# Patient Record
Sex: Male | Born: 1985 | Hispanic: No | Marital: Single | Smoking: Current every day smoker
Health system: Southern US, Community
[De-identification: ages and names within clinical notes are randomized; demographics above are authoritative.]

## PROBLEM LIST (undated history)

## (undated) DIAGNOSIS — K259 Gastric ulcer, unspecified as acute or chronic, without hemorrhage or perforation: Secondary | ICD-10-CM

---
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
## 2008-04-07 ENCOUNTER — Emergency Department (HOSPITAL_COMMUNITY): Admission: EM | Admit: 2008-04-07 | Discharge: 2008-04-08 | Payer: Self-pay | Admitting: Emergency Medicine

## 2009-08-14 ENCOUNTER — Emergency Department (HOSPITAL_COMMUNITY): Admission: EM | Admit: 2009-08-14 | Discharge: 2009-08-14 | Payer: Self-pay | Admitting: Emergency Medicine

## 2011-02-03 LAB — CBC
Platelets: 186 10*3/uL (ref 150–400)
RDW: 12.5 % (ref 11.5–15.5)
WBC: 9.3 10*3/uL (ref 4.0–10.5)

## 2011-02-03 LAB — DIFFERENTIAL
Basophils Relative: 0 % (ref 0–1)
Monocytes Absolute: 0.7 10*3/uL (ref 0.1–1.0)
Neutro Abs: 4.5 10*3/uL (ref 1.7–7.7)
Neutrophils Relative %: 49 % (ref 43–77)

## 2011-02-03 LAB — URINALYSIS, ROUTINE W REFLEX MICROSCOPIC
Bilirubin Urine: NEGATIVE
Hgb urine dipstick: NEGATIVE
Ketones, ur: NEGATIVE mg/dL
Specific Gravity, Urine: 1.022 (ref 1.005–1.030)
Urobilinogen, UA: 1 mg/dL (ref 0.0–1.0)
pH: 7 (ref 5.0–8.0)

## 2011-02-03 LAB — COMPREHENSIVE METABOLIC PANEL
ALT: 15 U/L (ref 0–53)
Albumin: 4.7 g/dL (ref 3.5–5.2)
Calcium: 9.8 mg/dL (ref 8.4–10.5)
GFR calc non Af Amer: >60 mL/min (ref >60)
Glucose, Bld: 102 mg/dL — ABNORMAL HIGH (ref 70–99)
Potassium: 3.7 mEq/L (ref 3.5–5.1)
Sodium: 137 mEq/L (ref 135–145)
Total Bilirubin: 0.4 mg/dL (ref 0.3–1.2)
Total Protein: 7.9 g/dL (ref 6.0–8.3)

## 2011-06-19 ENCOUNTER — Emergency Department (HOSPITAL_COMMUNITY)
Admission: EM | Admit: 2011-06-19 | Discharge: 2011-06-20 | Disposition: A | Discharge status: Home / Self Care | Payer: Self-pay | Attending: Emergency Medicine | Admitting: Emergency Medicine

## 2011-06-19 DIAGNOSIS — R1013 Epigastric pain: Secondary | ICD-10-CM | POA: Insufficient documentation

## 2011-06-19 DIAGNOSIS — R112 Nausea with vomiting, unspecified: Secondary | ICD-10-CM | POA: Insufficient documentation

## 2011-06-19 DIAGNOSIS — K2970 Gastritis, unspecified, without bleeding: Secondary | ICD-10-CM | POA: Insufficient documentation

## 2011-06-19 DIAGNOSIS — K2990 Gastroduodenitis, unspecified, without bleeding: Secondary | ICD-10-CM | POA: Insufficient documentation

## 2011-06-19 LAB — DIFFERENTIAL
Basophils Absolute: 0 10*3/uL (ref 0.0–0.1)
Eosinophils Absolute: 0.6 10*3/uL (ref 0.0–0.7)
Eosinophils Relative: 6 % — ABNORMAL HIGH (ref 0–5)
Lymphs Abs: 2.4 10*3/uL (ref 0.7–4.0)

## 2011-06-19 LAB — COMPREHENSIVE METABOLIC PANEL
Albumin: 4.5 g/dL (ref 3.5–5.2)
CO2: 31 mEq/L (ref 19–32)
Calcium: 11.4 mg/dL — ABNORMAL HIGH (ref 8.4–10.5)
Chloride: 96 mEq/L (ref 96–112)
GFR calc non Af Amer: >60 mL/min (ref >60)
Total Bilirubin: 0.7 mg/dL (ref 0.3–1.2)
Total Protein: 8.2 g/dL (ref 6.0–8.3)

## 2011-06-19 LAB — CBC
Hemoglobin: 14.8 g/dL (ref 13.0–17.0)
MCV: 88.6 fL (ref 78.0–100.0)
Platelets: 195 10*3/uL (ref 150–400)

## 2011-07-28 LAB — LIPASE, BLOOD: Lipase: 15

## 2011-07-28 LAB — URINALYSIS, ROUTINE W REFLEX MICROSCOPIC
Bilirubin Urine: NEGATIVE
Hgb urine dipstick: NEGATIVE
Ketones, ur: NEGATIVE
pH: 7.5

## 2011-07-28 LAB — POCT I-STAT, CHEM 8
Calcium, Ion: 1.2
Creatinine, Ser: 1.2
HCT: 48
Hemoglobin: 16.3
Potassium: 3.5

## 2011-07-28 LAB — DIFFERENTIAL
Eosinophils Relative: 8 — ABNORMAL HIGH
Monocytes Absolute: 0.7
Neutro Abs: 5.5
Neutrophils Relative %: 61

## 2011-07-28 LAB — CBC
Hemoglobin: 15.2
MCHC: 34.7
Platelets: 191
RDW: 13.1

## 2012-02-29 ENCOUNTER — Emergency Department (HOSPITAL_COMMUNITY)
Admission: EM | Admit: 2012-02-29 | Discharge: 2012-02-29 | Disposition: A | Discharge status: Home / Self Care | Payer: Worker's Compensation | Attending: Emergency Medicine | Admitting: Emergency Medicine

## 2012-02-29 ENCOUNTER — Encounter (HOSPITAL_COMMUNITY): Payer: Self-pay

## 2012-02-29 ENCOUNTER — Emergency Department (HOSPITAL_COMMUNITY): Payer: Worker's Compensation

## 2012-02-29 DIAGNOSIS — S93409A Sprain of unspecified ligament of unspecified ankle, initial encounter: Secondary | ICD-10-CM | POA: Insufficient documentation

## 2012-02-29 DIAGNOSIS — F17200 Nicotine dependence, unspecified, uncomplicated: Secondary | ICD-10-CM | POA: Insufficient documentation

## 2012-02-29 DIAGNOSIS — W19XXXA Unspecified fall, initial encounter: Secondary | ICD-10-CM | POA: Insufficient documentation

## 2012-02-29 DIAGNOSIS — S93402A Sprain of unspecified ligament of left ankle, initial encounter: Secondary | ICD-10-CM

## 2012-02-29 DIAGNOSIS — Y9269 Other specified industrial and construction area as the place of occurrence of the external cause: Secondary | ICD-10-CM | POA: Insufficient documentation

## 2012-02-29 DIAGNOSIS — M25579 Pain in unspecified ankle and joints of unspecified foot: Secondary | ICD-10-CM | POA: Insufficient documentation

## 2012-02-29 MED ORDER — OXYCODONE-ACETAMINOPHEN 5-325 MG PO TABS: 2.0000 | ORAL_TABLET | Freq: Four times a day (QID) | ORAL | Status: AC | PRN

## 2012-02-29 MED ORDER — IBUPROFEN 800 MG PO TABS: 800.0000 mg | ORAL_TABLET | Freq: Three times a day (TID) | ORAL | Status: AC | PRN

## 2012-02-29 MED ORDER — IBUPROFEN 800 MG PO TABS: 800.0000 mg | ORAL_TABLET | Freq: Once | ORAL | Status: DC

## 2012-02-29 MED ORDER — OXYCODONE-ACETAMINOPHEN 5-325 MG PO TABS
2.0000 | ORAL_TABLET | Freq: Once | ORAL | Status: AC
  Administered 2012-02-29: 2 via ORAL
  Filled 2012-02-29: qty 2

## 2012-02-29 NOTE — ED Notes (Signed)
Pt. Larey Seat about 30 minutes ago and fell and twisted onto his lt. Ankle. Swelling noted, +CNS

## 2012-02-29 NOTE — Discharge Instructions (Signed)

## 2012-02-29 NOTE — ED Provider Notes (Signed)
History   This chart was scribed for Cyndra Numbers, MD by Melba Coon. The patient was seen in room STRE5/STRE5 and the patient's care was started at 1:03PM.    CSN: [id number]  Arrival date & time 02/29/12  1240   First MD Initiated Contact with Patient 02/29/12 1257      Chief Complaint  Patient presents with  . Ankle Pain    (Consider location/radiation/quality/duration/timing/severity/associated sxs/prior treatment) HPI Joshua Gutierrez is a 26 y.o. male who presents to the Emergency Department complaining of constant, moderate to severe left ankle pain with an onset 45 min ago pertaining to a fall at work, no LOC, no head contact. Pt could not ambulate w/o pain. No left foot pain. No HA, fever, neck pain, sore throat, rash, back pain, CP, SOB, abd pain, n/v/d, dysuria, or extremity weakness, numbness, or tingling. No known allergies. No other pertinent medical symptoms. Pain is rated as sharp and 7/10. He has swelling of the left lateral malleolus without other significant deformity.  History reviewed. No pertinent past medical history.  History reviewed. No pertinent past surgical history.  History reviewed. No pertinent family history.  History  Substance Use Topics  . Smoking status: Current Everyday Smoker  . Smokeless tobacco: Not on file  . Alcohol Use: No      Review of Systems 10 Systems reviewed and all are negative for acute change except as noted in the HPI.   Allergies  Review of patient's allergies indicates no known allergies.  Home Medications  No current outpatient prescriptions on file.  BP 117/70  Pulse 101  Temp(Src) 97.8 F (36.6 C) (Oral)  Resp 16  SpO2 99%  Physical Exam  Nursing note and vitals reviewed. Constitutional: He is oriented to person, place, and time. He appears well-developed and well-nourished. No distress.  HENT:  Head: Normocephalic and atraumatic.  Eyes: EOM are normal.  Neck: Neck supple. No tracheal deviation present.   Cardiovascular: Normal rate.   Pulmonary/Chest: Effort normal. No respiratory distress.  Musculoskeletal: He exhibits edema (left lateral malleolus) and tenderness (left lateral malleolus).       Left ankle: He exhibits decreased range of motion (left lateral malleolus with decreased plantar and dorsiflexion ).       No TTP of left foot or left proximal lower extremity  Neurological: He is alert and oriented to person, place, and time.  Skin: Skin is warm and dry.  Psychiatric: He has a normal mood and affect. His behavior is normal.    ED Course  Procedures (including critical care time)  DIAGNOSTIC STUDIES: Oxygen Saturation is 99% on room aior, normal by my interpretation.    COORDINATION OF CARE:  1:04PM - left ankle XR, ice and pain meds will be ordered for the pt. 2:17PM - recheck; pt feel slightly better but there is still pain; imaging reviewed by EDMD and is negative; pt is ready for d/c; EDMD referred pt to f/u with orthopedist if needed; pt will receive ACE wrap, crutches, and ice  Labs Reviewed - No data to display Dg Ankle Complete Left  02/29/2012  *RADIOLOGY REPORT*  Clinical Data:  Ankle injury.  Severe ankle pain, swelling, and bruising.  LEFT ANKLE COMPLETE - 3+ VIEW  Comparison:  None.  Findings:  There is no evidence of fracture, dislocation, or joint effusion.  There is no evidence of arthropathy or other focal bone abnormality.  Soft tissues are unremarkable.  IMPRESSION: Negative.  Original Report Authenticated By: Danae Orleans, M.D.  1. Left ankle sprain       MDM  Patient presenting today with left ankle pain after mechanical injury. Patient with swelling but no fracture on plain film. Treated with pain medication with improved range of motion. Ace wrap and ice pack applied. Patient was given crutches and can followup with orthopedics as needed. He was discharged home in good condition.  I personally performed the services described in this  documentation, which was scribed in my presence. The recorded information has been reviewed and considered.          Cyndra Numbers, MD 02/29/12 1444

## 2012-02-29 NOTE — Progress Notes (Signed)
Orthopedic Tech Progress Note Patient Details:  Joshua Gutierrez [birth year] [id number]  Other Ortho Devices Type of Ortho Device: Crutches;Ace wrap Ortho Device Location: left ankle Ortho Device Interventions: Application Application completed by J. Cammer.  Leo Grosser T 02/29/2012, 2:31 PM

## 2013-11-13 ENCOUNTER — Emergency Department (HOSPITAL_COMMUNITY)
Admission: EM | Admit: 2013-11-13 | Discharge: 2013-11-13 | Discharge status: Against Medical Advice | Payer: PRIVATE HEALTH INSURANCE | Attending: Emergency Medicine | Admitting: Emergency Medicine

## 2013-11-13 ENCOUNTER — Encounter (HOSPITAL_COMMUNITY): Payer: Self-pay | Admitting: Emergency Medicine

## 2013-11-13 DIAGNOSIS — F17200 Nicotine dependence, unspecified, uncomplicated: Secondary | ICD-10-CM | POA: Insufficient documentation

## 2013-11-13 DIAGNOSIS — R1110 Vomiting, unspecified: Secondary | ICD-10-CM | POA: Insufficient documentation

## 2013-11-13 DIAGNOSIS — T50901A Poisoning by unspecified drugs, medicaments and biological substances, accidental (unintentional), initial encounter: Secondary | ICD-10-CM | POA: Insufficient documentation

## 2013-11-13 DIAGNOSIS — I10 Essential (primary) hypertension: Secondary | ICD-10-CM | POA: Insufficient documentation

## 2013-11-13 HISTORY — DX: Gastric ulcer, unspecified as acute or chronic, without hemorrhage or perforation: K25.9

## 2013-11-13 LAB — CBC WITH DIFFERENTIAL/PLATELET
Basophils Absolute: 0 10*3/uL (ref 0.0–0.1)
Basophils Relative: 0 % (ref 0–1)
EOS ABS: 0.6 10*3/uL (ref 0.0–0.7)
EOS PCT: 7 % — AB (ref 0–5)
HEMATOCRIT: 43.4 % (ref 39.0–52.0)
HEMOGLOBIN: 14.7 g/dL (ref 13.0–17.0)
Lymphocytes Relative: 33 % (ref 12–46)
Lymphs Abs: 3.2 10*3/uL (ref 0.7–4.0)
MCH: 30.6 pg (ref 26.0–34.0)
MCHC: 33.9 g/dL (ref 30.0–36.0)
MCV: 90.4 fL (ref 78.0–100.0)
MONOS PCT: 7 % (ref 3–12)
Monocytes Absolute: 0.7 10*3/uL (ref 0.1–1.0)
Neutro Abs: 5.1 10*3/uL (ref 1.7–7.7)
Neutrophils Relative %: 53 % (ref 43–77)
Platelets: 207 10*3/uL (ref 150–400)
RBC: 4.8 MIL/uL (ref 4.22–5.81)
RDW: 12.6 % (ref 11.5–15.5)
WBC: 9.6 10*3/uL (ref 4.0–10.5)

## 2013-11-13 LAB — LIPASE, BLOOD: Lipase: 16 U/L (ref 11–59)

## 2013-11-13 LAB — COMPREHENSIVE METABOLIC PANEL
ALT: 28 U/L (ref 0–53)
AST: 29 U/L (ref 0–37)
Albumin: 4.2 g/dL (ref 3.5–5.2)
Alkaline Phosphatase: 68 U/L (ref 39–117)
BUN: 17 mg/dL (ref 6–23)
CO2: 29 mEq/L (ref 19–32)
CREATININE: 0.74 mg/dL (ref 0.50–1.35)
Calcium: 9.6 mg/dL (ref 8.4–10.5)
Chloride: 100 mEq/L (ref 96–112)
GFR calc Af Amer: >90 mL/min (ref >90)
GFR calc non Af Amer: >90 mL/min (ref >90)
Glucose, Bld: 106 mg/dL — ABNORMAL HIGH (ref 70–99)
Potassium: 3.5 mEq/L — ABNORMAL LOW (ref 3.7–5.3)
Sodium: 142 mEq/L (ref 137–147)
Total Bilirubin: 0.2 mg/dL — ABNORMAL LOW (ref 0.3–1.2)
Total Protein: 8 g/dL (ref 6.0–8.3)

## 2013-11-13 NOTE — ED Notes (Signed)
Unable to locate pt  

## 2013-11-13 NOTE — ED Notes (Signed)
Pt in c/o vomiting since yesterday, states today he took an unknown medication that a friend told him was for ulcers approx 1 hour ago, took 1 pill, friend states patient had a near syncopal episode after taking pill and became pale, patient continues to vomit, VSS during triage, pt alert and oriented at this time

## 2013-11-13 NOTE — ED Notes (Signed)
No answer in waiting area.

## 2014-02-23 ENCOUNTER — Emergency Department (INDEPENDENT_AMBULATORY_CARE_PROVIDER_SITE_OTHER)
Admission: EM | Admit: 2014-02-23 | Discharge: 2014-02-23 | Disposition: A | Discharge status: Home / Self Care | Payer: PRIVATE HEALTH INSURANCE | Source: Home / Self Care

## 2014-02-23 ENCOUNTER — Encounter (HOSPITAL_COMMUNITY): Payer: Self-pay | Admitting: Emergency Medicine

## 2014-02-23 DIAGNOSIS — M436 Torticollis: Secondary | ICD-10-CM

## 2014-02-23 MED ORDER — HYDROCODONE-ACETAMINOPHEN 5-325 MG PO TABS: 1.0000 | ORAL_TABLET | ORAL | Status: DC | PRN

## 2014-02-23 MED ORDER — CYCLOBENZAPRINE HCL 10 MG PO TABS: 10.0000 mg | ORAL_TABLET | Freq: Two times a day (BID) | ORAL | Status: DC | PRN

## 2014-02-23 NOTE — Discharge Instructions (Signed)
Torticollis, Acute °You have suddenly (acutely) developed a twisted neck (torticollis). This is usually a self-limited condition. °CAUSES  °Acute torticollis may be caused by malposition, trauma or infection. Most commonly, acute torticollis is caused by sleeping in an awkward position. Torticollis may also be caused by the flexion, extension or twisting of the neck muscles beyond their normal position. Sometimes, the exact cause may not be known. °SYMPTOMS  °Usually, there is pain and limited movement of the neck. Your neck may twist to one side. °DIAGNOSIS  °The diagnosis is often made by physical examination. X-rays, CT scans or MRIs may be done if there is a history of trauma or concern of infection. °TREATMENT  °For a common, stiff neck that develops during sleep, treatment is focused on relaxing the contracted neck muscle. Medications (including shots) may be used to treat the problem. Most cases resolve in several days. Torticollis usually responds to conservative physical therapy. If left untreated, the shortened and spastic neck muscle can cause deformities in the face and neck. Rarely, surgery is required. °HOME CARE INSTRUCTIONS  °· Use over-the-counter and prescription medications as directed by your caregiver. °· Do stretching exercises and massage the neck as directed by your caregiver. °· Follow up with physical therapy if needed and as directed by your caregiver. °SEEK IMMEDIATE MEDICAL CARE IF:  °· You develop difficulty breathing or noisy breathing (stridor). °· You drool, develop trouble swallowing or have pain with swallowing. °· You develop numbness or weakness in the hands or feet. °· You have changes in speech or vision. °· You have problems with urination or bowel movements. °· You have difficulty walking. °· You have a fever. °· You have increased pain. °MAKE SURE YOU:  °· Understand these instructions. °· Will watch your condition. °· Will get help right away if you are not doing well or  get worse. °Document Released: 10/14/2000 Document Revised: 01/09/2012 Document Reviewed: 11/25/2009 °[employer]® Patient Information ©2014 [employer]. ° °

## 2014-02-23 NOTE — ED Provider Notes (Signed)
CSN: [id number]     Arrival date & time 02/23/14  1027 History   None    Chief Complaint  Patient presents with  . Neck Pain   (Consider location/radiation/quality/duration/timing/severity/associated sxs/prior Treatment)  HPI  She is a 28 year old male presenting today with complaints of "neck pain"  The patient states onset this past Friday and states hasn't been able to move his neck from side to side secondary to discomfort.  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  The patient states he is "unable to move" secondary to discomfort. Patient required assistance of girlfriend to be able to sit up on exam table.  Past Medical History  Diagnosis Date  . Gastric ulcer    History reviewed. No pertinent past surgical history. No family history on file. History  Substance Use Topics  . Smoking status: Current Every Day Smoker -- 0.50 packs/day    Types: Cigarettes  . Smokeless tobacco: Not on file  . Alcohol Use: Yes     Comment: occasionally    Review of Systems  Constitutional: Negative.  Negative for fever and fatigue.  HENT: Negative.  Negative for congestion, ear discharge, ear pain, mouth sores, nosebleeds, sinus pressure and sore throat.   Eyes: Negative.   Respiratory: Negative.   Cardiovascular: Negative.   Gastrointestinal: Negative.  Negative for nausea, vomiting and diarrhea.  Endocrine: Negative.   Genitourinary: Negative.   Musculoskeletal: Positive for neck pain and neck stiffness. Negative for arthralgias, back pain, gait problem, joint swelling and myalgias.  Skin: Negative.  Negative for color change, pallor, rash and wound.  Allergic/Immunologic: Positive for environmental allergies. Negative for food allergies and immunocompromised state.  Neurological: Negative.  Negative for dizziness, tremors, seizures, speech difficulty, weakness, light-headedness and numbness.   Hematological: Negative.   Psychiatric/Behavioral: Negative.     Allergies  Zolpidem  Home Medications   Prior to Admission medications   Not on File   BP 118/82  Pulse 73  Temp(Src) 97.1 F (36.2 C) (Oral)  Resp 16  SpO2 98%  Physical Exam  Nursing note and vitals reviewed. Constitutional: He is oriented to person, place, and time. He appears well-developed and well-nourished. No distress.  HENT:  Head: Normocephalic and atraumatic.  Right Ear: External ear normal.  Left Ear: External ear normal.  Nose: Nose normal.  Mouth/Throat: Oropharynx is clear and moist. No oropharyngeal exudate.  Eyes: Pupils are equal, round, and reactive to light. Right eye exhibits no discharge. Left eye exhibits no discharge. No scleral icterus.  Neck: Neck supple. No tracheal deviation present.  Negative for nuchal rigidity. Patient has limited range of motion secondary to discomfort to the left and right.  Strength of head movements in each direction 5/5.   Cardiovascular: Normal rate, regular rhythm, normal heart sounds and intact distal pulses.  Exam reveals no gallop and no friction rub.   No murmur heard. Pulmonary/Chest: Effort normal and breath sounds normal. No respiratory distress. He has no wheezes. He has no rales. He exhibits no tenderness.  Musculoskeletal:  Strength 5/5 all extremities.  Lymphadenopathy:    He has no cervical adenopathy.  Neurological: He is alert and oriented to person, place, and time. He has normal reflexes. He displays no atrophy, no tremor and normal reflexes. No cranial nerve deficit or sensory deficit. He exhibits normal muscle tone. Coordination normal.  Reflex Scores:      Tricep reflexes are 2+ on  the right side and 2+ on the left side.      Bicep reflexes are 2+ on the right side and 2+ on the left side.      Patellar reflexes are 2+ on the right side and 2+ on the left side. Cranial nerves II through XII grossly intact. Patient reports generalized  tenderness to the upper trapezius and paraspinal muscles. No evidence of spasm notated upon examination, however patient reports significant discomfort with palpation of left trapezius. No tenderness reported on cervical or thoracic bony prominences. Negative for radiculopathy.   Skin: He is not diaphoretic.    ED Course  Procedures (including critical care time) Labs Review Labs Reviewed - No data to display  Imaging Review No results found.   MDM   1. Torticollis, acute    Meds ordered this encounter  Medications  . HYDROcodone-acetaminophen (NORCO/VICODIN) 5-325 MG per tablet    Sig: Take 1-2 tablets by mouth every 4 (four) hours as needed.    Dispense:  10 tablet    Refill:  0  . cyclobenzaprine (FLEXERIL) 10 MG tablet    Sig: Take 1 tablet (10 mg total) by mouth 2 (two) times daily as needed for muscle spasms.    Dispense:  20 tablet    Refill:  0   The patient verbalizes understanding and agrees to plan of care.       Weber Cooksatherine Rossi, NP 02/23/14 1222

## 2014-02-23 NOTE — ED Notes (Signed)
Patient c/o neck and shoulder stiffness x 2 days. ROM is limited. Feels better lying flat but has difficulty sitting up after lying flat due to neck pain. Pt denies any injury. Pt is alert and oriented and in no acute distress.

## 2014-02-26 NOTE — ED Provider Notes (Signed)
Medical screening examination/treatment/procedure(s) were performed by a resident physician or non-physician practitioner and as the supervising physician I was immediately available for consultation/collaboration.  Yifan Auker, MD    Tomy Khim S Barnie Sopko, MD 02/26/14 0803 

## 2014-05-06 ENCOUNTER — Encounter (HOSPITAL_COMMUNITY): Payer: Self-pay | Admitting: Emergency Medicine

## 2014-05-06 ENCOUNTER — Emergency Department (HOSPITAL_COMMUNITY)
Admission: EM | Admit: 2014-05-06 | Discharge: 2014-05-06 | Disposition: A | Discharge status: Home / Self Care | Payer: PRIVATE HEALTH INSURANCE | Attending: Emergency Medicine | Admitting: Emergency Medicine

## 2014-05-06 DIAGNOSIS — R519 Headache, unspecified: Secondary | ICD-10-CM

## 2014-05-06 DIAGNOSIS — Z87828 Personal history of other (healed) physical injury and trauma: Secondary | ICD-10-CM | POA: Insufficient documentation

## 2014-05-06 DIAGNOSIS — G43909 Migraine, unspecified, not intractable, without status migrainosus: Secondary | ICD-10-CM | POA: Insufficient documentation

## 2014-05-06 DIAGNOSIS — Z8719 Personal history of other diseases of the digestive system: Secondary | ICD-10-CM | POA: Insufficient documentation

## 2014-05-06 DIAGNOSIS — F17200 Nicotine dependence, unspecified, uncomplicated: Secondary | ICD-10-CM | POA: Insufficient documentation

## 2014-05-06 DIAGNOSIS — R51 Headache: Secondary | ICD-10-CM

## 2014-05-06 MED ORDER — PROCHLORPERAZINE EDISYLATE 5 MG/ML IJ SOLN
10.0000 mg | Freq: Once | INTRAMUSCULAR | Status: AC
  Administered 2014-05-06: 10 mg via INTRAMUSCULAR
  Filled 2014-05-06: qty 2

## 2014-05-06 MED ORDER — KETOROLAC TROMETHAMINE 60 MG/2ML IM SOLN
60.0000 mg | Freq: Once | INTRAMUSCULAR | Status: AC
  Administered 2014-05-06: 60 mg via INTRAMUSCULAR
  Filled 2014-05-06: qty 2

## 2014-05-06 NOTE — ED Notes (Signed)
Bed: [id number] Expected date:  Expected time:  Means of arrival:  Comments: EMS 28yo M headache

## 2014-05-06 NOTE — ED Provider Notes (Signed)
Medical screening examination/treatment/procedure(s) were performed by non-physician practitioner and as supervising physician I was immediately available for consultation/collaboration.   EKG Interpretation None        Richardean Canalavid H Destyn Parfitt, MD 05/06/14 2352
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
## 2014-05-06 NOTE — Discharge Instructions (Signed)
Return to the ED for new concerns.

## 2015-07-16 ENCOUNTER — Emergency Department (HOSPITAL_COMMUNITY)
Admission: EM | Admit: 2015-07-16 | Discharge: 2015-07-16 | Disposition: A | Discharge status: Home / Self Care | Payer: PRIVATE HEALTH INSURANCE | Attending: Emergency Medicine | Admitting: Emergency Medicine

## 2015-07-16 ENCOUNTER — Encounter (HOSPITAL_COMMUNITY): Payer: Self-pay | Admitting: Emergency Medicine

## 2015-07-16 DIAGNOSIS — W57XXXA Bitten or stung by nonvenomous insect and other nonvenomous arthropods, initial encounter: Secondary | ICD-10-CM | POA: Insufficient documentation

## 2015-07-16 DIAGNOSIS — Y999 Unspecified external cause status: Secondary | ICD-10-CM | POA: Insufficient documentation

## 2015-07-16 DIAGNOSIS — Z8719 Personal history of other diseases of the digestive system: Secondary | ICD-10-CM | POA: Insufficient documentation

## 2015-07-16 DIAGNOSIS — Z720 Tobacco use: Secondary | ICD-10-CM | POA: Insufficient documentation

## 2015-07-16 DIAGNOSIS — Y939 Activity, unspecified: Secondary | ICD-10-CM | POA: Insufficient documentation

## 2015-07-16 DIAGNOSIS — S60561A Insect bite (nonvenomous) of right hand, initial encounter: Secondary | ICD-10-CM | POA: Insufficient documentation

## 2015-07-16 DIAGNOSIS — Y929 Unspecified place or not applicable: Secondary | ICD-10-CM | POA: Insufficient documentation

## 2015-07-16 MED ORDER — NAPROXEN 500 MG PO TABS
500.0000 mg | ORAL_TABLET | Freq: Two times a day (BID) | ORAL | Status: DC
Start: 2015-07-16 — End: 2019-07-11

## 2015-07-16 MED ORDER — DIPHENHYDRAMINE HCL 25 MG PO TABS: 25.0000 mg | ORAL_TABLET | Freq: Three times a day (TID) | ORAL | Status: DC | PRN

## 2015-07-16 NOTE — Discharge Instructions (Signed)
Apply ice to the area several times per day to help with the swelling. Take the Benadryl and Naprosyn as needed for itching and pain. Monitor for fever, redness moving towards your forearm.  Symptoms should resolve over the next several days to week.

## 2015-07-16 NOTE — ED Notes (Signed)
Patient states was mowing yesterday and got bit by something on top of R hand.   Patient states swelled last night but has gone down some today.   Patient states itchy and burning.

## 2015-07-16 NOTE — ED Provider Notes (Signed)
CSN: [id number]     Arrival date & time 07/16/15  0808 History   First MD Initiated Contact with Patient 07/16/15 [phone]     Chief Complaint  Patient presents with  . Insect Bite   HPI Patient was outside mowing his lawn on Tuesday. Patient does not recall getting stung by anything in particular. He did not injure his hand. However, when he went inside he noticed that he had swelling on the dorsal aspect of his right hand. He was having pain and discomfort and it hurt to move his fingers. His wife took a look at his hand and thought she noticed an insect sting mark. Last night the symptoms for more severe. He has been applying ice. He has not been taking any medications.  This morning he had to go to work and because it was still hurting he decided to come in to get evaluated. The swelling has gotten better. He is having less discomfort than he did last night. He denies any fevers or chills. No numbness or weakness. Past Medical History  Diagnosis Date  . Gastric ulcer    History reviewed. No pertinent past surgical history. No family history on file. Social History  Substance Use Topics  . Smoking status: Current Every Day Smoker -- 0.50 packs/day    Types: Cigarettes  . Smokeless tobacco: None  . Alcohol Use: Yes     Comment: occasionally    Review of Systems  All other systems reviewed and are negative.     Allergies  Zolpidem  Home Medications   Prior to Admission medications   Not on File   BP 122/78 mmHg  Pulse 86  Temp(Src) 98.1 F (36.7 C) (Oral)  Resp 18  Ht  (1.676 m)  Wt 149 lb 2 oz (67.643 kg)  BMI 24.08 kg/m2  SpO2 100% Physical Exam  Constitutional: He appears well-developed and well-nourished. No distress.  HENT:  Head: Normocephalic and atraumatic.  Right Ear: External ear normal.  Left Ear: External ear normal.  Eyes: Conjunctivae are normal. Right eye exhibits no discharge. Left eye exhibits no discharge. No scleral icterus.  Neck: Neck  supple. No tracheal deviation present.  Cardiovascular: Normal rate.   Pulmonary/Chest: Effort normal. No stridor. No respiratory distress.  Musculoskeletal: He exhibits tenderness. He exhibits no edema.       Right hand: He exhibits tenderness. He exhibits no bony tenderness and no laceration.  Swelling on the dorsal aspect of the right hand in between the index and middle finger overlying the metacarpal area, no pustule, mild erythema, full active range of motion of the fingers, no lymphangitic streaking, no increased warmth  Neurological: He is alert. Cranial nerve deficit: no gross deficits.  Skin: Skin is warm and dry. No rash noted.  Psychiatric: He has a normal mood and affect.  Nursing note and vitals reviewed.   ED Course  Procedures (including critical care time)   MDM   Final diagnoses:  Insect bite    Patient most likely had some type of insect bite or sting. Swelling that developed has been decreasing in severity. I doubt any infection. Patient does not recall any trauma. I do not think any x-rays are necessary.  Plan on supportive care with at histamines and NSAIDs. Monitor for signs of infection.    Linwood Dibbles, MD 07/16/15 [phone]

## 2017-03-09 ENCOUNTER — Emergency Department (HOSPITAL_COMMUNITY): Payer: Self-pay

## 2017-03-09 ENCOUNTER — Emergency Department (HOSPITAL_COMMUNITY)
Admission: EM | Admit: 2017-03-09 | Discharge: 2017-03-10 | Disposition: A | Discharge status: Home / Self Care | Payer: Self-pay | Attending: Emergency Medicine | Admitting: Emergency Medicine

## 2017-03-09 ENCOUNTER — Encounter (HOSPITAL_COMMUNITY): Payer: Self-pay

## 2017-03-09 DIAGNOSIS — L02416 Cutaneous abscess of left lower limb: Secondary | ICD-10-CM | POA: Insufficient documentation

## 2017-03-09 DIAGNOSIS — L03818 Cellulitis of other sites: Secondary | ICD-10-CM

## 2017-03-09 DIAGNOSIS — L0291 Cutaneous abscess, unspecified: Secondary | ICD-10-CM

## 2017-03-09 DIAGNOSIS — Z79899 Other long term (current) drug therapy: Secondary | ICD-10-CM | POA: Insufficient documentation

## 2017-03-09 DIAGNOSIS — F17210 Nicotine dependence, cigarettes, uncomplicated: Secondary | ICD-10-CM | POA: Insufficient documentation

## 2017-03-09 LAB — CBC WITH DIFFERENTIAL/PLATELET
BASOS ABS: 0 10*3/uL (ref 0.0–0.1)
Basophils Relative: 0 %
Eosinophils Absolute: 1 10*3/uL — ABNORMAL HIGH (ref 0.0–0.7)
Eosinophils Relative: 9 %
HEMATOCRIT: 43.2 % (ref 39.0–52.0)
Hemoglobin: 14.7 g/dL (ref 13.0–17.0)
LYMPHS PCT: 22 %
Lymphs Abs: 2.3 10*3/uL (ref 0.7–4.0)
MCH: 29.6 pg (ref 26.0–34.0)
MCHC: 34 g/dL (ref 30.0–36.0)
MCV: 86.9 fL (ref 78.0–100.0)
Monocytes Absolute: 0.7 10*3/uL (ref 0.1–1.0)
Monocytes Relative: 7 %
NEUTROS ABS: 6.5 10*3/uL (ref 1.7–7.7)
Neutrophils Relative %: 62 %
Platelets: 193 10*3/uL (ref 150–400)
RBC: 4.97 MIL/uL (ref 4.22–5.81)
RDW: 12.2 % (ref 11.5–15.5)
WBC: 10.5 10*3/uL (ref 4.0–10.5)

## 2017-03-09 LAB — COMPREHENSIVE METABOLIC PANEL
ALBUMIN: 4.1 g/dL (ref 3.5–5.0)
ALK PHOS: 70 U/L (ref 38–126)
ALT: 19 U/L (ref 17–63)
AST: 24 U/L (ref 15–41)
Anion gap: 8 (ref 5–15)
BILIRUBIN TOTAL: 0.4 mg/dL (ref 0.3–1.2)
BUN: 12 mg/dL (ref 6–20)
CO2: 25 mmol/L (ref 22–32)
Calcium: 9.3 mg/dL (ref 8.9–10.3)
Chloride: 104 mmol/L (ref 101–111)
Creatinine, Ser: 0.88 mg/dL (ref 0.61–1.24)
GFR calc Af Amer: >60 mL/min (ref >60)
GFR calc non Af Amer: >60 mL/min (ref >60)
Glucose, Bld: 119 mg/dL — ABNORMAL HIGH (ref 65–99)
POTASSIUM: 3.2 mmol/L — AB (ref 3.5–5.1)
Sodium: 137 mmol/L (ref 135–145)
TOTAL PROTEIN: 7.2 g/dL (ref 6.5–8.1)

## 2017-03-09 LAB — I-STAT CG4 LACTIC ACID, ED: Lactic Acid, Venous: 1.13 mmol/L (ref 0.5–1.9)

## 2017-03-09 MED ORDER — IBUPROFEN 400 MG PO TABS
600.0000 mg | ORAL_TABLET | Freq: Once | ORAL | Status: AC
  Administered 2017-03-09: 600 mg via ORAL
  Filled 2017-03-09: qty 1

## 2017-03-09 MED ORDER — LIDOCAINE HCL (PF) 1 % IJ SOLN
2.0000 mL | Freq: Once | INTRAMUSCULAR | Status: AC
  Administered 2017-03-10: 2 mL
  Filled 2017-03-09: qty 5

## 2017-03-09 MED ORDER — SULFAMETHOXAZOLE-TRIMETHOPRIM 800-160 MG PO TABS
1.0000 | ORAL_TABLET | Freq: Once | ORAL | Status: AC
  Administered 2017-03-10: 1 via ORAL
  Filled 2017-03-09: qty 1

## 2017-03-09 NOTE — ED Triage Notes (Signed)
Pt states he was mowing grass yesterday and then noticed bump to outer aspect of left ankle. He woke up this morning with large abscess in same spot. Pt states he doesn't remember being bitten by anything

## 2017-03-09 NOTE — ED Provider Notes (Signed)
[facility] Provider Note   CSN: [id number] Arrival date & time: 03/09/17  2159     History   Chief Complaint Chief Complaint  Patient presents with  . Abscess    HPI Joshua Gutierrez is a 31 y.o. male.  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  This morning when he woke up, the redness had increased pain increased and now it appears to have a small punctate central area that is draining fluid      Past Medical History:  Diagnosis Date  . Gastric ulcer     There are no active problems to display for this patient.   History reviewed. No pertinent surgical history.     Home Medications    Prior to Admission medications   Medication Sig Start Date End Date Taking? Authorizing Provider  diphenhydrAMINE (BENADRYL) 25 MG tablet Take 1 tablet (25 mg total) by mouth every 8 (eight) hours as needed for itching. 07/16/15   Linwood DibblesKnapp, Jon, MD  ibuprofen (ADVIL,MOTRIN) 600 MG tablet Take 1 tablet (600 mg total) by mouth every 6 (six) hours as needed. 03/10/17   Earley FavorSchulz, Shakeema Lippman, NP  naproxen (NAPROSYN) 500 MG tablet Take 1 tablet (500 mg total) by mouth 2 (two) times daily. 07/16/15   Linwood DibblesKnapp, Jon, MD  sulfamethoxazole-trimethoprim (BACTRIM DS,SEPTRA DS) 800-160 MG tablet Take 1 tablet by mouth 2 (two) times daily. 03/10/17 03/17/17  Earley FavorSchulz, Sonal Dorwart, NP  sulfamethoxazole-trimethoprim (BACTRIM DS,SEPTRA DS) 800-160 MG tablet Take 1 tablet by mouth 2 (two) times daily. 03/10/17 03/17/17  Earley FavorSchulz, Sirinity Outland, NP  sulfamethoxazole-trimethoprim (BACTRIM DS,SEPTRA DS) 800-160 MG tablet Take 1 tablet by mouth 2 (two) times daily. 03/10/17 03/17/17  Earley FavorSchulz, Luisalberto Beegle, NP    Family History No family history on file.  Social History Social History  Substance Use Topics  . Smoking status: Current Every Day Smoker    Packs/day: 0.50    Types: Cigarettes  . Smokeless tobacco: Never Used    . Alcohol use Yes     Comment: occasionally     Allergies   Zolpidem   Review of Systems Review of Systems  Constitutional: Negative for fever.  Respiratory: Negative for stridor.   Cardiovascular: Positive for leg swelling.  Skin: Positive for wound.  All other systems reviewed and are negative.    Physical Exam Updated Vital Signs BP 115/79 (BP Location: Right Arm)   Pulse (!) 102   Temp 98.4 F (36.9 C) (Oral)   Resp 18   Ht 5\' 6"  (1.676 m)   Wt 68 kg   SpO2 96%   BMI 24.21 kg/m   Physical Exam  Constitutional: He appears well-developed.  HENT:  Head: Normocephalic.  Eyes: Pupils are equal, round, and reactive to light.  Neck: Normal range of motion.  Cardiovascular: Normal rate.   Pulmonary/Chest: Effort normal.  Musculoskeletal: He exhibits tenderness.       Legs: Neurological: He is alert.  Nursing note and vitals reviewed.    ED Treatments / Results  Labs (all labs ordered are listed, but only abnormal results are displayed) Labs Reviewed  COMPREHENSIVE METABOLIC PANEL - Abnormal; Notable for the following:       Result Value   Potassium 3.2 (*)    Glucose, Bld 119 (*)    All other components within normal limits  CBC WITH DIFFERENTIAL/PLATELET - Abnormal; Notable for the following:  Eosinophils Absolute 1.0 (*)    All other components within normal limits  I-STAT CG4 LACTIC ACID, ED    EKG  EKG Interpretation None       Radiology Dg Ankle Complete Left  Result Date: 03/09/2017 CLINICAL DATA:  Bump at the outer aspect of the left ankle. Assess for abscess. Initial encounter. EXAM: LEFT ANKLE COMPLETE - 3+ VIEW COMPARISON:  Left ankle radiographs performed 02/29/2012 FINDINGS: There is no evidence of fracture or dislocation. No osseous erosions are seen. The ankle mortise is intact; the interosseous space is within normal limits. No talar tilt or subluxation is seen. The joint spaces are preserved. Soft tissue swelling is noted along  the lateral aspect of the lower leg. No radiopaque foreign bodies are seen. IMPRESSION: 1. No evidence of fracture or dislocation. No osseous erosions seen. 2. Soft tissue swelling along the lateral aspect of the lower leg. Electronically Signed   By: Roanna Raider M.D.   On: 03/09/2017 23:05    Procedures .Marland KitchenIncision and Drainage Date/Time: 03/09/2017 11:58 PM Performed by: Earley Favor Authorized by: Earley Favor   Consent:    Consent obtained:  Verbal   Consent given by:  Patient   Risks discussed:  Bleeding and incomplete drainage Location:    Type:  Abscess   Size:  3   Location:  Lower extremity   Lower extremity location:  Leg Pre-procedure details:    Skin preparation:  Betadine Anesthesia (see MAR for exact dosages):    Anesthesia method:  Local infiltration   Local anesthetic:  Lidocaine 1% w/o epi Procedure type:    Complexity:  Simple Procedure details:    Needle aspiration: no     Incision types:  Single straight   Incision depth:  Dermal   Scalpel blade:  11   Wound management:  Probed and deloculated   Drainage:  Purulent   Drainage amount:  Scant   Packing materials:  None Post-procedure details:    Patient tolerance of procedure:  Tolerated well, no immediate complications   (including critical care time)  Medications Ordered in ED Medications  sulfamethoxazole-trimethoprim (BACTRIM DS,SEPTRA DS) 800-160 MG per tablet 1 tablet (not administered)  lidocaine (PF) (XYLOCAINE) 1 % injection 2 mL (2 mLs Infiltration Given by Other 03/10/17 0000)  ibuprofen (ADVIL,MOTRIN) tablet 600 mg (600 mg Oral Given 03/09/17 2359)     Initial Impression / Assessment and Plan / ED Course  I have reviewed the triage vital signs and the nursing notes.  Pertinent labs & imaging results that were available during my care of the patient were reviewed by me and considered in my medical decision making (see chart for details).     Only a scant amount of pertinent material  was evacuated from the abscess.  Due to the surrounding erythema.  I started the patient on warm compresses and Septra to follow-up with his PCP or the emergency department for worsening symptoms  Final Clinical Impressions(s) / ED Diagnoses   Final diagnoses:  Abscess  Cellulitis of other specified site    New Prescriptions New Prescriptions   IBUPROFEN (ADVIL,MOTRIN) 600 MG TABLET    Take 1 tablet (600 mg total) by mouth every 6 (six) hours as needed.   SULFAMETHOXAZOLE-TRIMETHOPRIM (BACTRIM DS,SEPTRA DS) 800-160 MG TABLET    Take 1 tablet by mouth 2 (two) times daily.   SULFAMETHOXAZOLE-TRIMETHOPRIM (BACTRIM DS,SEPTRA DS) 800-160 MG TABLET    Take 1 tablet by mouth 2 (two) times daily.   SULFAMETHOXAZOLE-TRIMETHOPRIM (BACTRIM DS,SEPTRA DS)  800-160 MG TABLET    Take 1 tablet by mouth 2 (two) times daily.     Earley Favor, NP 03/10/17 0002    Earley Favor, NP 03/10/17 Orpah Cobb    Lorre Nick, MD 03/12/17 2103

## 2017-03-10 MED ORDER — IBUPROFEN 600 MG PO TABS: 600.0000 mg | ORAL_TABLET | Freq: Four times a day (QID) | ORAL | 0 refills | Status: DC | PRN

## 2017-03-10 MED ORDER — SULFAMETHOXAZOLE-TRIMETHOPRIM 800-160 MG PO TABS: 1.0000 | ORAL_TABLET | Freq: Two times a day (BID) | ORAL | 0 refills | Status: AC

## 2017-03-10 NOTE — Discharge Instructions (Signed)
Your abscess was opened and drained a small amount of purulent material was expressed to the surrounding redness.  I started you on an antibiotic like you to apply warm compress to the area several times a day for the next 2-3 days, as well as take the antibiotic as instructed until all tablets have been completed. Please return if you have worsening symptoms, start running a fever.

## 2017-03-10 NOTE — ED Notes (Signed)
Patient left at this time with all belongings. 

## 2019-02-27 ENCOUNTER — Ambulatory Visit (HOSPITAL_COMMUNITY)
Admission: EM | Admit: 2019-02-27 | Discharge: 2019-02-27 | Disposition: A | Discharge status: Home / Self Care | Payer: Self-pay | Attending: Family Medicine | Admitting: Family Medicine

## 2019-02-27 ENCOUNTER — Encounter (HOSPITAL_COMMUNITY): Payer: Self-pay

## 2019-02-27 ENCOUNTER — Other Ambulatory Visit: Payer: Self-pay

## 2019-02-27 DIAGNOSIS — S86112A Strain of other muscle(s) and tendon(s) of posterior muscle group at lower leg level, left leg, initial encounter: Secondary | ICD-10-CM

## 2019-02-27 MED ORDER — HYDROCODONE-ACETAMINOPHEN 5-325 MG PO TABS: 1.0000 | ORAL_TABLET | Freq: Four times a day (QID) | ORAL | 0 refills | Status: DC | PRN

## 2019-02-27 MED ORDER — IBUPROFEN 800 MG PO TABS: 800.0000 mg | ORAL_TABLET | Freq: Three times a day (TID) | ORAL | 0 refills | Status: DC

## 2019-02-27 NOTE — ED Provider Notes (Signed)
[facility]   [id number] 02/27/19 Arrival Time: 1621  ASSESSMENT & PLAN:  1. Gastrocnemius tear, left, initial encounter    No indications for plain imaging at this time. Discussed. Suspect strain/tear of gastrocnemius.  Cam walker and crutches provided. OTC analgesics as needed. See AVS for d/c instructions. Work note provided.  Recommend: Follow-up Information    Schedule an appointment as soon as possible for a visit  with [facility].   Specialty:  Specialist Contact information: [address] [place] [id number] [phone]           Reviewed expectations re: course of current medical issues. Questions answered. Outlined signs and symptoms indicating need for more acute intervention. Patient verbalized understanding. After Visit Summary given.  SUBJECTIVE: History from: patient. Joshua Gutierrez is a 33 y.o. male who reports persistent marked pain of his left calf; described as aching and throbbing without radiation. Onset: abrupt, today. Injury/trama: reports pushing against a palette coming down a ramp at work; stepped back and "felt a pop" in his left calf; immediate pain and inability to bear weight. Symptoms have progressed to a point and plateaued since beginning. Aggravating factors: any movement of foot/ankle. Alleviating factors: rest. Associated symptoms: none reported. Extremity sensation changes or weakness: none. Self treatment: has not tried OTCs for relief of pain. History of similar: no.  History reviewed. No pertinent surgical history.   ROS: As per HPI. All other systems negative.   OBJECTIVE:  Vitals:   02/27/19 1635  BP: 127/77  Pulse: 100  Resp: 18  Temp: 98.1 F (36.7 C)  TempSrc: Oral  SpO2: 99%    General appearance: alert; no distress HEENT: [place]; AT Neck: supple with FROM Lungs: unlabored respirations Extremities: . LLE: warm and well perfused; fairly well localized marked tenderness over left mid  calf; without gross deformities; with mild to moderate swelling; with no bruising; ROM: significant mid calf pain with movement of foot; Thompson squeeze test negative; unable to bear weight secondary to reported pain CV: brisk extremity capillary refill of LLE; 2+ DP and PT pulse of LLE. Skin: warm and dry; no visible rashes Neurologic: normal patellar reflexes of RLE and LLE; normal sensation of RLE and LLE; normal strength of RLE and LLE Psychological: alert and cooperative; normal mood and affect  Allergies  Allergen Reactions  . Zolpidem Other (See Comments)    Dizziness, seizures    Past Medical History:  Diagnosis Date  . Gastric ulcer    Social History   Socioeconomic History  . Marital status: Single    Spouse name: Not on file  . Number of children: Not on file  . Years of education: Not on file  . Highest education level: Not on file  Occupational History  . Not on file  Social Needs  . Financial resource strain: Not on file  . Food insecurity:    Worry: Not on file    Inability: Not on file  . Transportation needs:    Medical: Not on file    Non-medical: Not on file  Tobacco Use  . Smoking status: Current Every Day Smoker    Packs/day: 0.50    Types: Cigarettes  . Smokeless tobacco: Never Used  Substance and Sexual Activity  . Alcohol use: Yes    Comment: occasionally  . Drug use: No  . Sexual activity: Not on file  Lifestyle  . Physical activity:    Days per week: Not on file    Minutes per session:  Not on file  . Stress: Not on file  Relationships  . Social connections:    Talks on phone: Not on file    Gets together: Not on file    Attends religious service: Not on file    Active member of club or organization: Not on file    Attends meetings of clubs or organizations: Not on file    Relationship status: Not on file  Other Topics Concern  . Not on file  Social History Narrative  . Not on file   FH: HTN  History reviewed. No pertinent  surgical history.    Mardella Layman, MD 03/06/19 [phone]

## 2019-02-27 NOTE — ED Triage Notes (Signed)
Patient presents to Urgent Care with complaints of left leg pain since rolling a cart full of pallets down a ramp backwards. Patient states he felt a pop on his lower leg and now has excruciating pain. Pt states he can move his foot but with pain. No obvious deformity noted.

## 2019-02-27 NOTE — Discharge Instructions (Addendum)
Use your crutches to remain non weight bearing until you can bear weight while wearing your walking boot.  Be aware, pain medications may cause drowsiness. Please do not drive, operate heavy machinery or make important decisions while on this medication, it can cloud your judgement.

## 2019-07-11 ENCOUNTER — Other Ambulatory Visit: Payer: Self-pay

## 2019-07-11 ENCOUNTER — Ambulatory Visit (HOSPITAL_COMMUNITY)
Admission: EM | Admit: 2019-07-11 | Discharge: 2019-07-11 | Disposition: A | Discharge status: Home / Self Care | Payer: Worker's Compensation | Attending: Nurse Practitioner | Admitting: Nurse Practitioner

## 2019-07-11 ENCOUNTER — Encounter (HOSPITAL_COMMUNITY): Payer: Self-pay | Admitting: Emergency Medicine

## 2019-07-11 DIAGNOSIS — S29012A Strain of muscle and tendon of back wall of thorax, initial encounter: Secondary | ICD-10-CM | POA: Diagnosis not present

## 2019-07-11 MED ORDER — CYCLOBENZAPRINE HCL 10 MG PO TABS: 10.0000 mg | ORAL_TABLET | Freq: Two times a day (BID) | ORAL | 0 refills | Status: DC | PRN

## 2019-07-11 MED ORDER — IBUPROFEN 800 MG PO TABS: 800.0000 mg | ORAL_TABLET | Freq: Three times a day (TID) | ORAL | 0 refills | Status: DC

## 2019-07-11 NOTE — ED Triage Notes (Signed)
Center, upper back pain that started yesterday.  Patient was lifting something, twisted and felt pain

## 2019-07-11 NOTE — Discharge Instructions (Signed)
Take medications as prescribed  Apply heat to affected areas at least three times a day for 20 minutes each  No heavy lifting or pulling for at least 5 days

## 2019-07-11 NOTE — ED Provider Notes (Signed)
[place]    CSN: [id number] Arrival date & time: 07/11/19  1934      History   Chief Complaint Chief Complaint  Patient presents with  . Back Pain    HPI Joshua Gutierrez is a 33 y.o. male.   Subjective:  Joshua Gutierrez is a 33 y.o. male who presents for evaluation of back pain. The patient has had no prior back problems. Symptoms have been present for 2 days and are unchanged.  Onset was related to / precipitated by a twisting movement and lifting a heavy object. The pain is located in the thoracic region and does not radiate. The pain is described as aching and sharp. It occurs all day. He rates his pain as a 9 on a scale of 0-10. Symptoms are exacerbated by deep breathing, extension, flexion, lifting and twisting. Symptoms are improved by nothing. He has also tried nothing which provided no symptom relief. He denies any leg weakness, tingling/burning in the legs, urinary hesitancy, urinary incontinence, urinary retention, hematuria, bowel incontinence or groin/perineal numbness associated with the back pain. The patient has no "red flag" history indicative of complicated back pain.  The following portions of the patient's history were reviewed and updated as appropriate: allergies, current medications, past family history, past medical history, past social history, past surgical history and problem list.        Past Medical History:  Diagnosis Date  . Gastric ulcer     There are no active problems to display for this patient.   History reviewed. No pertinent surgical history.     Home Medications    Prior to Admission medications   Medication Sig Start Date End Date Taking? Authorizing Provider  cyclobenzaprine (FLEXERIL) 10 MG tablet Take 1 tablet (10 mg total) by mouth 2 (two) times daily as needed for muscle spasms. 07/11/19   Enrique Sack, FNP  ibuprofen (ADVIL) 800 MG tablet Take 1 tablet (800 mg total) by mouth 3 (three) times daily. 07/11/19    Enrique Sack, FNP  diphenhydrAMINE (BENADRYL) 25 MG tablet Take 1 tablet (25 mg total) by mouth every 8 (eight) hours as needed for itching. 07/16/15 07/11/19  Dorie Rank, MD    Family History History reviewed. No pertinent family history.  Social History Social History   Tobacco Use  . Smoking status: Current Every Day Smoker    Packs/day: 0.50    Types: Cigarettes  . Smokeless tobacco: Never Used  Substance Use Topics  . Alcohol use: Yes    Comment: occasionally  . Drug use: No     Allergies   Zolpidem   Review of Systems Review of Systems  Genitourinary: Negative for dysuria.  Musculoskeletal: Positive for back pain.  Neurological: Negative for weakness and numbness.  All other systems reviewed and are negative.    Physical Exam Triage Vital Signs ED Triage Vitals  Enc Vitals Group     BP 07/11/19 2007 120/77     Pulse Rate 07/11/19 2007 (!) 105     Resp 07/11/19 2007 16     Temp 07/11/19 2007 98.5 F (36.9 C)     Temp Source 07/11/19 2007 Temporal     SpO2 07/11/19 2007 100 %     Weight --      Height --      Head Circumference --      Peak Flow --      Pain Score 07/11/19 2004 9     Pain Loc --  Pain Edu? --      Excl. in GC? --    No data found.  Updated Vital Signs BP 120/77 (BP Location: Right Arm)   Pulse (!) 105   Temp 98.5 F (36.9 C) (Temporal)   Resp 16   SpO2 100%   Visual Acuity Right Eye Distance:   Left Eye Distance:   Bilateral Distance:    Right Eye Near:   Left Eye Near:    Bilateral Near:     Physical Exam Vitals signs reviewed.  Constitutional:      Appearance: Normal appearance.  HENT:     Head: Normocephalic.  Neck:     Musculoskeletal: Normal range of motion and neck supple.  Cardiovascular:     Rate and Rhythm: Normal rate and regular rhythm.  Pulmonary:     Effort: Pulmonary effort is normal.     Breath sounds: Normal breath sounds.  Musculoskeletal: Normal range of motion.     Thoracic back: He  exhibits tenderness and pain. He exhibits no bony tenderness, no swelling, no edema and no deformity.  Skin:    General: Skin is warm and dry.  Neurological:     General: No focal deficit present.     Mental Status: He is alert and oriented to person, place, and time.      UC Treatments / Results  Labs (all labs ordered are listed, but only abnormal results are displayed) Labs Reviewed - No data to display  EKG   Radiology No results found.  Procedures Procedures (including critical care time)  Medications Ordered in UC Medications - No data to display  Initial Impression / Assessment and Plan / UC Course  I have reviewed the triage vital signs and the nursing notes.  Pertinent labs & imaging results that were available during my care of the patient were reviewed by me and considered in my medical decision making (see chart for details).     33 yo male presenting with acute thoracic back pain after a twisting movement and lifting a heavy object two days ago. No red flag history. Pain likely due to an acute strain. Natural history and expected course discussed. Questions answered.Agricultural engineerducational material distributed. Proper lifting, bending technique discussed. Short (2-4 day) period of relative rest recommended until acute symptoms improve. Heat to affected area as needed for local pain relief. NSAIDs per medication orders. Muscle relaxants per medication orders.  Today's evaluation has revealed no signs of a dangerous process. Discussed diagnosis with patient and/or guardian. Patient and/or guardian aware of their diagnosis, possible red flag symptoms to watch out for and need for close follow up. Patient and/or guardian understands verbal and written discharge instructions. Patient and/or guardian comfortable with plan and disposition.  Patient and/or guardian has a clear mental status at this time, good insight into illness (after discussion and teaching) and has clear judgment to  make decisions regarding their care  This care was provided during an unprecedented National Emergency due to the Novel Coronavirus (COVID-19) pandemic. COVID-19 infections and transmission risks place heavy strains on healthcare resources.  As this pandemic evolves, our facility, providers, and staff strive to respond fluidly, to remain operational, and to provide care relative to available resources and information. Outcomes are unpredictable and treatments are without well-defined guidelines. Further, the impact of COVID-19 on all aspects of urgent care, including the impact to patients seeking care for reasons other than COVID-19, is unavoidable during this national emergency. At this time of the global pandemic, management  of patients has significantly changed, even for non-COVID positive patients given high local and regional COVID volumes at this time requiring high healthcare system and resource utilization. The standard of care for management of both COVID suspected and non-COVID suspected patients continues to change rapidly at the local, regional, national, and global levels. This patient was worked up and treated to the best available but ever changing evidence and resources available at this current time.   Documentation was completed with the aid of voice recognition software. Transcription may contain typographical errors. Final Clinical Impressions(s) / UC Diagnoses   Final diagnoses:  Strain of thoracic back region     Discharge Instructions     1. Take medications as prescribed  2. Apply heat to affected areas at least three times a day for 20 minutes each  3. No heavy lifting or pulling for at least 5 days     ED Prescriptions    Medication Sig Dispense Auth. Provider   ibuprofen (ADVIL) 800 MG tablet Take 1 tablet (800 mg total) by mouth 3 (three) times daily. 21 tablet Lurline Idol, FNP   cyclobenzaprine (FLEXERIL) 10 MG tablet Take 1 tablet (10 mg total) by mouth 2  (two) times daily as needed for muscle spasms. 20 tablet Lurline Idol, FNP     Controlled Substance Prescriptions [place] Controlled Substance Registry consulted? Not Applicable   Lurline Idol, [facility] 07/11/19 2042

## 2019-12-12 ENCOUNTER — Other Ambulatory Visit: Payer: Self-pay

## 2019-12-12 ENCOUNTER — Encounter (HOSPITAL_COMMUNITY): Payer: Self-pay | Admitting: Emergency Medicine

## 2019-12-12 ENCOUNTER — Emergency Department (HOSPITAL_COMMUNITY)
Admission: EM | Admit: 2019-12-12 | Discharge: 2019-12-13 | Disposition: A | Discharge status: Home / Self Care | Payer: Self-pay | Attending: Emergency Medicine | Admitting: Emergency Medicine

## 2019-12-12 DIAGNOSIS — K2970 Gastritis, unspecified, without bleeding: Secondary | ICD-10-CM | POA: Insufficient documentation

## 2019-12-12 DIAGNOSIS — R1013 Epigastric pain: Secondary | ICD-10-CM

## 2019-12-12 LAB — COMPREHENSIVE METABOLIC PANEL
ALT: 20 U/L (ref 0–44)
AST: 20 U/L (ref 15–41)
Albumin: 4 g/dL (ref 3.5–5.0)
Alkaline Phosphatase: 71 U/L (ref 38–126)
Anion gap: 12 (ref 5–15)
BUN: 9 mg/dL (ref 6–20)
CO2: 27 mmol/L (ref 22–32)
Calcium: 9.2 mg/dL (ref 8.9–10.3)
Chloride: 98 mmol/L (ref 98–111)
Creatinine, Ser: 0.87 mg/dL (ref 0.61–1.24)
GFR calc Af Amer: >60 mL/min (ref >60)
GFR calc non Af Amer: >60 mL/min (ref >60)
Glucose, Bld: 102 mg/dL — ABNORMAL HIGH (ref 70–99)
Potassium: 3.7 mmol/L (ref 3.5–5.1)
Sodium: 137 mmol/L (ref 135–145)
Total Bilirubin: 0.5 mg/dL (ref 0.3–1.2)
Total Protein: 7.8 g/dL (ref 6.5–8.1)

## 2019-12-12 LAB — CBC
HCT: 49.1 % (ref 39.0–52.0)
Hemoglobin: 16.2 g/dL (ref 13.0–17.0)
MCH: 29.3 pg (ref 26.0–34.0)
MCHC: 33 g/dL (ref 30.0–36.0)
MCV: 88.8 fL (ref 80.0–100.0)
Platelets: 264 10*3/uL (ref 150–400)
RBC: 5.53 MIL/uL (ref 4.22–5.81)
RDW: 13.4 % (ref 11.5–15.5)
WBC: 11 10*3/uL — ABNORMAL HIGH (ref 4.0–10.5)
nRBC: 0 % (ref 0.0–0.2)

## 2019-12-12 LAB — URINALYSIS, ROUTINE W REFLEX MICROSCOPIC
Bilirubin Urine: NEGATIVE
Glucose, UA: NEGATIVE mg/dL
Hgb urine dipstick: NEGATIVE
Ketones, ur: NEGATIVE mg/dL
Leukocytes,Ua: NEGATIVE
Nitrite: NEGATIVE
Protein, ur: NEGATIVE mg/dL
Specific Gravity, Urine: 1.012 (ref 1.005–1.030)
pH: 7 (ref 5.0–8.0)

## 2019-12-12 LAB — LIPASE, BLOOD: Lipase: 24 U/L (ref 11–51)

## 2019-12-12 MED ORDER — SODIUM CHLORIDE 0.9% FLUSH: 3.0000 mL | Freq: Once | INTRAVENOUS | Status: DC

## 2019-12-12 NOTE — ED Triage Notes (Signed)
Patient reports mid abdominal pain with emesis onset this week , denies diarrhea or fever , history of gastric ulcer .

## 2019-12-13 ENCOUNTER — Emergency Department (HOSPITAL_COMMUNITY): Payer: Self-pay

## 2019-12-13 MED ORDER — PANTOPRAZOLE SODIUM 20 MG PO TBEC: 20.0000 mg | DELAYED_RELEASE_TABLET | Freq: Every day | ORAL | 0 refills | Status: DC

## 2019-12-13 MED ORDER — PANTOPRAZOLE SODIUM 40 MG PO TBEC
40.0000 mg | DELAYED_RELEASE_TABLET | Freq: Once | ORAL | Status: AC
  Administered 2019-12-13: 03:00:00 40 mg via ORAL
  Filled 2019-12-13: qty 1

## 2019-12-13 MED ORDER — ALUM & MAG HYDROXIDE-SIMETH 200-200-20 MG/5ML PO SUSP
30.0000 mL | Freq: Once | ORAL | Status: AC
  Administered 2019-12-13: 30 mL via ORAL
  Filled 2019-12-13: qty 30

## 2019-12-13 NOTE — Discharge Instructions (Addendum)
1. Medications: Protonix, usual home medications 2. Treatment: rest, drink plenty of fluids, advance diet slowly 3. Follow Up: Please followup with your primary doctor in 2 days for discussion of your diagnoses and further evaluation after today's visit; if you do not have a primary care doctor use the resource guide provided to find one; Please return to the ER for persistent vomiting, high fevers or worsening symptoms  

## 2019-12-13 NOTE — ED Provider Notes (Signed)
[facility] EMERGENCY DEPARTMENT Provider Note   CSN: [id number] Arrival date & time: 12/12/19  2110     History Chief Complaint  Patient presents with  . Abdominal Pain    Joshua Gutierrez is a 34 y.o. male with a no major medical hx presents to the Emergency Department complaining of gradual, persistent, progressively worsening epigastric abd pain onset 2 weeks ago.  Pt reports the pain has been recurrent for more than 1 year.  Pt reports he vomits 1-3x per day when he has the pain.  He reports emesis is NBNB.  He reports drinking and eating gives him a sour taste.  He has never seen GI for this or had an endoscopy.  Pt denies being told that he had an ulcer in the past.  He reports zantac seems to make the symptoms better. Pt denies abdominal surgeries.  Pt denies fever, chills, headache, neck pain, chest pain, SOB, diarrhea, weakness, dizziness, syncope.   The history is provided by the patient and medical records. No language interpreter was used.       Past Medical History:  Diagnosis Date  . Gastric ulcer     There are no problems to display for this patient.   History reviewed. No pertinent surgical history.     No family history on file.  Social History   Tobacco Use  . Smoking status: Current Every Day Smoker    Packs/day: 0.50    Types: Cigarettes  . Smokeless tobacco: Never Used  Substance Use Topics  . Alcohol use: Yes    Comment: occasionally  . Drug use: No    Home Medications Prior to Admission medications   Medication Sig Start Date End Date Taking? Authorizing Provider  cyclobenzaprine (FLEXERIL) 10 MG tablet Take 1 tablet (10 mg total) by mouth 2 (two) times daily as needed for muscle spasms. 07/11/19   Lurline Idol, FNP  ibuprofen (ADVIL) 800 MG tablet Take 1 tablet (800 mg total) by mouth 3 (three) times daily. 07/11/19   Lurline Idol, FNP  pantoprazole (PROTONIX) 20 MG tablet Take 1 tablet (20 mg total) by mouth daily.  12/13/19   Dru Laurel, Dahlia Client, PA-C  diphenhydrAMINE (BENADRYL) 25 MG tablet Take 1 tablet (25 mg total) by mouth every 8 (eight) hours as needed for itching. 07/16/15 07/11/19  Linwood Dibbles, MD    Allergies    Zolpidem  Review of Systems   Review of Systems  Constitutional: Negative for appetite change, diaphoresis, fatigue, fever and unexpected weight change.  HENT: Negative for mouth sores.   Eyes: Negative for visual disturbance.  Respiratory: Negative for cough, chest tightness, shortness of breath and wheezing.   Cardiovascular: Negative for chest pain.  Gastrointestinal: Positive for abdominal pain, nausea and vomiting. Negative for constipation and diarrhea.  Endocrine: Negative for polydipsia, polyphagia and polyuria.  Genitourinary: Negative for dysuria, frequency, hematuria and urgency.  Musculoskeletal: Negative for back pain and neck stiffness.  Skin: Negative for rash.  Allergic/Immunologic: Negative for immunocompromised state.  Neurological: Negative for syncope, light-headedness and headaches.  Hematological: Does not bruise/bleed easily.  Psychiatric/Behavioral: Negative for sleep disturbance. The patient is not nervous/anxious.     Physical Exam Updated Vital Signs BP 121/88 (BP Location: Right Arm)   Pulse 81   Resp 16   Ht 5\' 6"  (1.676 m)   Wt 68 kg   SpO2 98%   BMI 24.21 kg/m   Physical Exam Vitals and nursing note reviewed.  Constitutional:      General:  He is not in acute distress.    Appearance: He is not diaphoretic.  HENT:     Head: Normocephalic.  Eyes:     General: No scleral icterus.    Conjunctiva/sclera: Conjunctivae normal.  Cardiovascular:     Rate and Rhythm: Normal rate and regular rhythm.     Pulses: Normal pulses.          Radial pulses are 2+ on the right side and 2+ on the left side.  Pulmonary:     Effort: No tachypnea, accessory muscle usage, prolonged expiration, respiratory distress or retractions.     Breath sounds: No  stridor.     Comments: Equal chest rise. No increased work of breathing. Abdominal:     General: There is no distension.     Palpations: Abdomen is soft.     Tenderness: There is abdominal tenderness in the right upper quadrant and epigastric area. There is guarding. There is no right CVA tenderness, left CVA tenderness or rebound.  Musculoskeletal:     Cervical back: Normal range of motion.     Comments: Moves all extremities equally and without difficulty.  Skin:    General: Skin is warm and dry.     Capillary Refill: Capillary refill takes less than 2 seconds.  Neurological:     Mental Status: He is alert.     GCS: GCS eye subscore is 4. GCS verbal subscore is 5. GCS motor subscore is 6.     Comments: Speech is clear and goal oriented.  Psychiatric:        Mood and Affect: Mood normal.     ED Results / Procedures / Treatments   Labs (all labs ordered are listed, but only abnormal results are displayed) Labs Reviewed  COMPREHENSIVE METABOLIC PANEL - Abnormal; Notable for the following components:      Result Value   Glucose, Bld 102 (*)    All other components within normal limits  CBC - Abnormal; Notable for the following components:   WBC 11.0 (*)    All other components within normal limits  LIPASE, BLOOD  URINALYSIS, ROUTINE W REFLEX MICROSCOPIC    Radiology US Abdomen Limited  Result Date: 12/13/2019 CLINICAL DATA:  Epigastric abdominal pain today EXAM: ULTRASOUND ABDOMEN LIMITED RIGHT UPPER QUADRANT COMPARISON:  Ultrasound 08/14/2009 FINDINGS: Gallbladder: No gallstones or wall thickening visualized. No sonographic Murphy sign noted by sonographer. Common bile duct: Diameter: 3.9 proximal, 3.6 distal.  Nondilated. Liver: No focal lesion identified. Within normal limits in parenchymal echogenicity. Portal vein is patent on color Doppler imaging with normal direction of blood flow towards the liver. Other: None. IMPRESSION: Unremarkable right upper quadrant ultrasound.  Electronically Signed   By: Lovena Le M.D.   On: 12/13/2019 03:23    Procedures Procedures (including critical care time)  Medications Ordered in ED Medications  sodium chloride flush (NS) 0.9 % injection 3 mL (has no administration in time range)  alum & mag hydroxide-simeth (MAALOX/MYLANTA) 200-200-20 MG/5ML suspension 30 mL (has no administration in time range)  pantoprazole (PROTONIX) EC tablet 40 mg (40 mg Oral Given 12/13/19 0239)    ED Course  I have reviewed the triage vital signs and the nursing notes.  Pertinent labs & imaging results that were available during my care of the patient were reviewed by me and considered in my medical decision making (see chart for details).    MDM Rules/Calculators/A&P  Patient presents with greater than 1 year of epigastric and right upper quadrant abdominal pain.  Pain is intermittent.  On exam patient with tenderness and guarding in the epigastrium and right upper quadrant.  Abdominal exam is otherwise benign.  Labs are reassuring.  Epic note shows gastric ulcer in patient's history however patient denies ever being diagnosed with this.  He has never seen GI or had an EGD.  Will give Protonix and obtain right upper quadrant ultrasound.  3:33 AM Patient with improvement in pain after Protonix and GI cocktail.  Ultrasound without evidence of cholecystitis.  Labs reassuring without evidence of pancreatitis.  Patient reports that he used to drink alcohol but does not anymore.  Suspect gastritis or peptic ulcer disease.  He will need close follow-up with gastroenterology.  He is well-appearing and has tolerated p.o. here in the emergency department without difficulty.   Final Clinical Impression(s) / ED Diagnoses Final diagnoses:  Epigastric pain  Gastritis, presence of bleeding unspecified, unspecified chronicity, unspecified gastritis type    Rx / DC Orders ED Discharge Orders         Ordered    pantoprazole  (PROTONIX) 20 MG tablet  Daily     12/13/19 0329           Gearldene Fiorenza, Dahlia Client, PA-C 12/13/19 0334    Palumbo, April, MD 12/13/19 [phone]

## 2019-12-13 NOTE — ED Notes (Signed)
Patient verbalizes understanding of discharge instructions. Opportunity for questioning and answers were provided. Armband removed by staff, pt discharged from ED.  

## 2021-05-10 ENCOUNTER — Emergency Department (HOSPITAL_COMMUNITY)
Admission: EM | Admit: 2021-05-10 | Discharge: 2021-05-10 | Disposition: A | Discharge status: Home / Self Care | Payer: Self-pay | Attending: Emergency Medicine | Admitting: Emergency Medicine

## 2021-05-10 DIAGNOSIS — F17210 Nicotine dependence, cigarettes, uncomplicated: Secondary | ICD-10-CM | POA: Diagnosis not present

## 2021-05-10 DIAGNOSIS — R29810 Facial weakness: Secondary | ICD-10-CM | POA: Diagnosis present

## 2021-05-10 DIAGNOSIS — G510 Bell's palsy: Secondary | ICD-10-CM | POA: Diagnosis not present

## 2021-05-10 MED ORDER — VALACYCLOVIR HCL 1 G PO TABS: 1000.0000 mg | ORAL_TABLET | Freq: Three times a day (TID) | ORAL | 0 refills | Status: DC

## 2021-05-10 MED ORDER — HYPROMELLOSE (GONIOSCOPIC) 2.5 % OP SOLN: 1.0000 [drp] | OPHTHALMIC | 12 refills | Status: DC | PRN

## 2021-05-10 MED ORDER — PREDNISONE 20 MG PO TABS: 40.0000 mg | ORAL_TABLET | Freq: Every day | ORAL | 0 refills | Status: DC

## 2021-05-10 MED ORDER — FLUORESCEIN SODIUM 1 MG OP STRP
1.0000 | ORAL_STRIP | Freq: Once | OPHTHALMIC | Status: AC
  Administered 2021-05-10: 1 via OPHTHALMIC
  Filled 2021-05-10: qty 1

## 2021-05-10 NOTE — ED Triage Notes (Signed)
Pt c/o L sided facial paralysis x5days, concerned for stroke. PA Kelly in room to evaluate, pt fairly certain bells pallsy

## 2021-05-10 NOTE — Discharge Instructions (Addendum)
Please follow-up with your doctor and with the eye doctor listed.  Take medications as prescribed.  You should tape your eyelid closed at night to prevent the eye from drying out.

## 2021-05-10 NOTE — ED Provider Notes (Signed)
Emergency Medicine Provider Triage Evaluation Note  Ubaldo Daywalt , a 35 y.o. male  was evaluated in triage.  Pt complains of L sided facial drooping onset Thursday. Constant, unchanged. No medications PTA. No fever, extremity numbness/paresthesias, extremity weakness.  Review of Systems  Positive: L sided facial droop Negative: Extremity weakness  Physical Exam  BP (!) 121/92 (BP Location: Right Arm)   Pulse 94   Temp 98.5 F (36.9 C)   Resp 14   SpO2 99%  Gen:   Awake, no distress   Resp:  Normal effort  MSK:   Moves extremities without difficulty  Other:  Paralysis of L eyebrow with flattening of L nasolabial fold; asymmetric smile. Normal grip strength, sensation, and strength against resistance in all extremities. Ambulatory.  Medical Decision Making  Medically screening exam initiated at 10:20 PM.  Appropriate orders placed.  Virgel Viloria was informed that the remainder of the evaluation will be completed by another provider, this initial triage assessment does not replace that evaluation, and the importance of remaining in the ED until their evaluation is complete.  L sided facial droop; Bell's palsy   Antony Madura, Cordelia Poche 05/10/21 2222    Linwood Dibbles, MD 05/11/21 1009

## 2021-05-10 NOTE — ED Notes (Signed)
Pt ambulatory to room.

## 2021-05-10 NOTE — ED Provider Notes (Signed)
[facility] EMERGENCY DEPARTMENT Provider Note   CSN: [id number] Arrival date & time: 05/10/21  2203     History Chief Complaint  Patient presents with   Facial Droop    Joshua Gutierrez is a 35 y.o. male.  Patient presents to the ED with a chief complaint left facial droop.  States that symptoms started 5 days ago.  Denies recent illness.  Denies numbness, weakness, slurred, speech or vision changes.  States that his left eye does feel irritated.  Denies any treatments PTA.  The history is provided by the patient. No language interpreter was used.      Past Medical History:  Diagnosis Date   Gastric ulcer     There are no problems to display for this patient.   No past surgical history on file.     No family history on file.  Social History   Tobacco Use   Smoking status: Every Day    Packs/day: 0.50    Pack years: 0.00    Types: Cigarettes   Smokeless tobacco: Never  Vaping Use   Vaping Use: Never used  Substance Use Topics   Alcohol use: Yes    Comment: occasionally   Drug use: No    Home Medications Prior to Admission medications   Medication Sig Start Date End Date Taking? Authorizing Provider  cyclobenzaprine (FLEXERIL) 10 MG tablet Take 1 tablet (10 mg total) by mouth 2 (two) times daily as needed for muscle spasms. 07/11/19   Lurline Idol, FNP  ibuprofen (ADVIL) 800 MG tablet Take 1 tablet (800 mg total) by mouth 3 (three) times daily. 07/11/19   Lurline Idol, FNP  pantoprazole (PROTONIX) 20 MG tablet Take 1 tablet (20 mg total) by mouth daily. 12/13/19   Muthersbaugh, Dahlia Client, PA-C  diphenhydrAMINE (BENADRYL) 25 MG tablet Take 1 tablet (25 mg total) by mouth every 8 (eight) hours as needed for itching. 07/16/15 07/11/19  Linwood Dibbles, MD    Allergies    Zolpidem  Review of Systems   Review of Systems  All other systems reviewed and are negative.  Physical Exam Updated Vital Signs BP (!) 121/92 (BP Location: Right Arm)    Pulse 94   Temp 98.5 F (36.9 C)   Resp 14   SpO2 99%   Physical Exam Vitals and nursing note reviewed.  Constitutional:      General: He is not in acute distress.    Appearance: He is well-developed. He is not ill-appearing.  HENT:     Head: Normocephalic and atraumatic.  Eyes:     Conjunctiva/sclera: Conjunctivae normal.     Comments: No fluorescein uptake  Cardiovascular:     Rate and Rhythm: Normal rate.  Pulmonary:     Effort: Pulmonary effort is normal. No respiratory distress.  Abdominal:     General: There is no distension.  Musculoskeletal:     Cervical back: Neck supple.     Comments: Moves all extremities  Skin:    General: Skin is warm and dry.  Neurological:     Mental Status: He is alert and oriented to person, place, and time.     Comments: Facial nerve palsy as pictured Normal sensation and strength of extremities  Psychiatric:        Mood and Affect: Mood normal.        Behavior: Behavior normal.     ED Results / Procedures / Treatments   Labs (all labs ordered are listed, but only abnormal results are displayed)  Labs Reviewed - No data to display  EKG None  Radiology No results found.  Procedures Procedures   Medications Ordered in ED Medications - No data to display  ED Course  I have reviewed the triage vital signs and the nursing notes.  Pertinent labs & imaging results that were available during my care of the patient were reviewed by me and considered in my medical decision making (see chart for details).    MDM Rules/Calculators/A&P                          Patient here with facial droop x 5 days.  Physical exam consistent with Bell's Palsy.    Trial Valtrex and Prednisone.  Outpatient f/u.  Seen by and discussed with Dr. Stevie Kern. Final Clinical Impression(s) / ED Diagnoses Final diagnoses:  Bell palsy    Rx / DC Orders ED Discharge Orders     None        Roxy Horseman, PA-C 05/10/21 2300    Milagros Loll, MD 05/11/21 1537

## 2021-05-10 NOTE — ED Notes (Signed)
Patient verbalizes understanding of discharge instructions. Prescriptions and follow-up care reviewed. Opportunity for questioning and answers were provided. Armband removed by staff, pt discharged from ED ambulatory.  

## 2021-05-11 ENCOUNTER — Emergency Department (HOSPITAL_COMMUNITY)
Admission: EM | Admit: 2021-05-11 | Discharge: 2021-05-12 | Disposition: A | Discharge status: Home / Self Care | Payer: PRIVATE HEALTH INSURANCE | Attending: Emergency Medicine | Admitting: Emergency Medicine

## 2021-05-11 DIAGNOSIS — R112 Nausea with vomiting, unspecified: Secondary | ICD-10-CM | POA: Insufficient documentation

## 2021-05-11 DIAGNOSIS — F17210 Nicotine dependence, cigarettes, uncomplicated: Secondary | ICD-10-CM | POA: Insufficient documentation

## 2021-05-11 DIAGNOSIS — G510 Bell's palsy: Secondary | ICD-10-CM | POA: Insufficient documentation

## 2021-05-11 DIAGNOSIS — R1013 Epigastric pain: Secondary | ICD-10-CM | POA: Insufficient documentation

## 2021-05-11 NOTE — ED Triage Notes (Addendum)
Pt BIB [facility]. Was seen here yesterday dx with bells palsy yesterday. Today pt c/o onset NV, upper abdominal pain, headache, and worsening of Bells Palsy with vision loss. States emesis x2 today

## 2021-05-11 NOTE — ED Provider Notes (Signed)
Emergency Medicine Provider Triage Evaluation Note  Joshua Gutierrez , a 35 y.o. male  was evaluated in triage.  Pt complains of syncope.  States he passed out at home.  Seen yesterday and diagnosed with Bell's palsy.  States he feels like his symptoms are worsening.  Review of Systems  Positive: Syncope, vomiting Negative: Fever, cough  Physical Exam  BP (!) 126/91 (BP Location: Right Arm)   Pulse (!) 181   Temp 98.3 F (36.8 C)   Resp 16   SpO2 90%  Gen:   Awake, no distress   Resp:  Normal effort  MSK:   Moves extremities without difficulty  Other:  Persistent left sided facial paralysis, normal ROM and strength in upper and lower extremities  Medical Decision Making  Medically screening exam initiated at 11:56 PM.  Appropriate orders placed.  Joshua Gutierrez was informed that the remainder of the evaluation will be completed by another provider, this initial triage assessment does not replace that evaluation, and the importance of remaining in the ED until their evaluation is complete.     Joshua Horseman, PA-C 05/11/21 2358    Joshua Booze, MD 05/12/21 [phone]

## 2021-05-12 ENCOUNTER — Other Ambulatory Visit: Payer: Self-pay

## 2021-05-12 LAB — CBC WITH DIFFERENTIAL/PLATELET
Abs Immature Granulocytes: 0.04 10*3/uL (ref 0.00–0.07)
Basophils Absolute: 0.1 10*3/uL (ref 0.0–0.1)
Basophils Relative: 0 %
Eosinophils Absolute: 0.3 10*3/uL (ref 0.0–0.5)
Eosinophils Relative: 2 %
HCT: 40.4 % (ref 39.0–52.0)
Hemoglobin: 12 g/dL — ABNORMAL LOW (ref 13.0–17.0)
Immature Granulocytes: 0 %
Lymphocytes Relative: 21 %
Lymphs Abs: 2.7 10*3/uL (ref 0.7–4.0)
MCH: 22.1 pg — ABNORMAL LOW (ref 26.0–34.0)
MCHC: 29.7 g/dL — ABNORMAL LOW (ref 30.0–36.0)
MCV: 74.3 fL — ABNORMAL LOW (ref 80.0–100.0)
Monocytes Absolute: 1.1 10*3/uL — ABNORMAL HIGH (ref 0.1–1.0)
Monocytes Relative: 9 %
Neutro Abs: 8.6 10*3/uL — ABNORMAL HIGH (ref 1.7–7.7)
Neutrophils Relative %: 68 %
Platelets: 297 10*3/uL (ref 150–400)
RBC: 5.44 MIL/uL (ref 4.22–5.81)
RDW: 20.3 % — ABNORMAL HIGH (ref 11.5–15.5)
WBC: 12.7 10*3/uL — ABNORMAL HIGH (ref 4.0–10.5)
nRBC: 0 % (ref 0.0–0.2)

## 2021-05-12 LAB — COMPREHENSIVE METABOLIC PANEL WITH GFR
ALT: 17 U/L (ref 0–44)
AST: 17 U/L (ref 15–41)
Albumin: 3.8 g/dL (ref 3.5–5.0)
Alkaline Phosphatase: 62 U/L (ref 38–126)
Anion gap: 4 — ABNORMAL LOW (ref 5–15)
BUN: 12 mg/dL (ref 6–20)
CO2: 31 mmol/L (ref 22–32)
Calcium: 9.3 mg/dL (ref 8.9–10.3)
Chloride: 103 mmol/L (ref 98–111)
Creatinine, Ser: 0.94 mg/dL (ref 0.61–1.24)
GFR, Estimated: >60 mL/min
Glucose, Bld: 108 mg/dL — ABNORMAL HIGH (ref 70–99)
Potassium: 3.1 mmol/L — ABNORMAL LOW (ref 3.5–5.1)
Sodium: 138 mmol/L (ref 135–145)
Total Bilirubin: 0.6 mg/dL (ref 0.3–1.2)
Total Protein: 7.6 g/dL (ref 6.5–8.1)

## 2021-05-12 LAB — LIPASE, BLOOD: Lipase: 27 U/L (ref 11–51)

## 2021-05-12 MED ORDER — NAPROXEN 500 MG PO TABS: 500.0000 mg | ORAL_TABLET | Freq: Two times a day (BID) | ORAL | 0 refills | Status: DC

## 2021-05-12 MED ORDER — ONDANSETRON HCL 4 MG/2ML IJ SOLN
4.0000 mg | INTRAMUSCULAR | Status: AC
  Administered 2021-05-12: 4 mg via INTRAVENOUS
  Filled 2021-05-12: qty 2

## 2021-05-12 MED ORDER — SODIUM CHLORIDE 0.9 % IV BOLUS
1000.0000 mL | Freq: Once | INTRAVENOUS | Status: AC
  Administered 2021-05-12: 1000 mL via INTRAVENOUS

## 2021-05-12 MED ORDER — PANTOPRAZOLE SODIUM 20 MG PO TBEC: 20.0000 mg | DELAYED_RELEASE_TABLET | Freq: Every day | ORAL | 0 refills | Status: DC

## 2021-05-12 MED ORDER — ONDANSETRON HCL 4 MG PO TABS: 4.0000 mg | ORAL_TABLET | Freq: Four times a day (QID) | ORAL | 0 refills | Status: DC

## 2021-05-12 MED ORDER — PANTOPRAZOLE SODIUM 40 MG IV SOLR
40.0000 mg | Freq: Once | INTRAVENOUS | Status: AC
  Administered 2021-05-12: 40 mg via INTRAVENOUS
  Filled 2021-05-12: qty 40

## 2021-05-12 NOTE — ED Provider Notes (Signed)
[facility] EMERGENCY DEPARTMENT Provider Note   CSN: [id number] Arrival date & time: 05/11/21  2347     History Chief Complaint  Patient presents with   Abdominal Pain   Facial Droop    Joshua Gutierrez is a 35 y.o. male.   Abdominal Pain  This patient is a 35 year old male, he states his only medical history is that he has a stomach ulcer in the past.  He has been prescribed multiple medications recently including ibuprofen, cyclobenzaprine, ultimately he was given steroids yesterday after being diagnosed with a Bell palsy.  He states that after taking the prednisone he started to have increasing nausea and epigastric pain.  The patient denies any surgical history including prior gallbladder disease, no history of pancreatitis, no history of appendicitis.  Past Medical History:  Diagnosis Date   Gastric ulcer     There are no problems to display for this patient.   No past surgical history on file.     No family history on file.  Social History   Tobacco Use   Smoking status: Every Day    Packs/day: 0.50    Pack years: 0.00    Types: Cigarettes   Smokeless tobacco: Never  Vaping Use   Vaping Use: Never used  Substance Use Topics   Alcohol use: Yes    Comment: occasionally   Drug use: No    Home Medications Prior to Admission medications   Medication Sig Start Date End Date Taking? Authorizing Provider  hydroxypropyl methylcellulose / hypromellose (ISOPTO TEARS / GONIOVISC) 2.5 % ophthalmic solution Place 1 drop into the left eye as needed for dry eyes. 05/10/21  Yes Roxy Horseman, PA-C  naproxen (NAPROSYN) 500 MG tablet Take 1 tablet (500 mg total) by mouth 2 (two) times daily with a meal. 05/12/21  Yes Eber Hong, MD  ondansetron (ZOFRAN) 4 MG tablet Take 1 tablet (4 mg total) by mouth every 6 (six) hours. 05/12/21  Yes Eber Hong, MD  pantoprazole (PROTONIX) 20 MG tablet Take 1 tablet (20 mg total) by mouth daily. 05/12/21 06/11/21 Yes  Eber Hong, MD  predniSONE (DELTASONE) 20 MG tablet Take 2 tablets (40 mg total) by mouth daily. 05/10/21  Yes Roxy Horseman, PA-C  valACYclovir (VALTREX) 1000 MG tablet Take 1 tablet (1,000 mg total) by mouth 3 (three) times daily. 05/10/21  Yes Roxy Horseman, PA-C  diphenhydrAMINE (BENADRYL) 25 MG tablet Take 1 tablet (25 mg total) by mouth every 8 (eight) hours as needed for itching. 07/16/15 07/11/19  Linwood Dibbles, MD    Allergies    Zolpidem  Review of Systems   Review of Systems  Gastrointestinal:  Positive for abdominal pain.  All other systems reviewed and are negative.  Physical Exam Updated Vital Signs BP 121/88   Pulse 77   Temp 97.8 F (36.6 C) (Oral)   Resp 15   SpO2 99%   Physical Exam Vitals and nursing note reviewed.  Constitutional:      General: He is not in acute distress.    Appearance: He is well-developed.  HENT:     Head: Normocephalic and atraumatic.     Mouth/Throat:     Pharynx: No oropharyngeal exudate.  Eyes:     General: No scleral icterus.       Right eye: No discharge.        Left eye: No discharge.     Conjunctiva/sclera: Conjunctivae normal.     Pupils: Pupils are equal, round, and reactive to light.  Neck:     Thyroid: No thyromegaly.     Vascular: No JVD.  Cardiovascular:     Rate and Rhythm: Normal rate and regular rhythm.     Heart sounds: Normal heart sounds. No murmur heard.   No friction rub. No gallop.  Pulmonary:     Effort: Pulmonary effort is normal. No respiratory distress.     Breath sounds: Normal breath sounds. No wheezing or rales.  Abdominal:     General: Bowel sounds are normal. There is no distension.     Palpations: Abdomen is soft. There is no mass.     Tenderness: There is abdominal tenderness.     Comments: Tenderness is focused in the epigastrium, there is no lower abdominal tenderness no peritoneal signs, no guarding, no masses  Genitourinary:    Comments: No CVA tenderness Musculoskeletal:         General: No tenderness. Normal range of motion.     Cervical back: Normal range of motion and neck supple.     Right lower leg: No edema.     Left lower leg: No edema.  Lymphadenopathy:     Cervical: No cervical adenopathy.  Skin:    General: Skin is warm and dry.     Findings: No erythema or rash.  Neurological:     Mental Status: He is alert.     Coordination: Coordination normal.     Comments: Left-sided facial droop, Bell palsy present  Psychiatric:        Behavior: Behavior normal.    ED Results / Procedures / Treatments   Labs (all labs ordered are listed, but only abnormal results are displayed) Labs Reviewed  CBC WITH DIFFERENTIAL/PLATELET - Abnormal; Notable for the following components:      Result Value   WBC 12.7 (*)    Hemoglobin 12.0 (*)    MCV 74.3 (*)    MCH 22.1 (*)    MCHC 29.7 (*)    RDW 20.3 (*)    Neutro Abs 8.6 (*)    Monocytes Absolute 1.1 (*)    All other components within normal limits  COMPREHENSIVE METABOLIC PANEL - Abnormal; Notable for the following components:   Potassium 3.1 (*)    Glucose, Bld 108 (*)    Anion gap 4 (*)    All other components within normal limits  LIPASE, BLOOD    EKG None  Radiology No results found.  Procedures Procedures   Medications Ordered in ED Medications  sodium chloride 0.9 % bolus 1,000 mL (0 mLs Intravenous Stopped 05/12/21 0955)  ondansetron (ZOFRAN) injection 4 mg (4 mg Intravenous Given 05/12/21 0848)  pantoprazole (PROTONIX) injection 40 mg (40 mg Intravenous Given 05/12/21 0847)    ED Course  I have reviewed the triage vital signs and the nursing notes.  Pertinent labs & imaging results that were available during my care of the patient were reviewed by me and considered in my medical decision making (see chart for details).    MDM Rules/Calculators/A&P                          The patient's vital signs reflect a normal blood pressure temperature and heart rate.  He is tender on exam  consistent with having either peptic ulcer disease cholecystitis pancreatitis or some other upper abdominal pathology.  That being said he has a nonsurgical abdomen.  We will give IV fluids some medication including pantoprazole and Zofran and check labs.  The patient is improved, vital signs are normal, labs reviewed, overall unremarkable.  He will be started on the medication such as naproxen Zofran and pantoprazole for home, stable for discharge, no surgical findings on exam  Final Clinical Impression(s) / ED Diagnoses Final diagnoses:  Non-intractable vomiting with nausea, unspecified vomiting type    Rx / DC Orders ED Discharge Orders          Ordered    ondansetron (ZOFRAN) 4 MG tablet  Every 6 hours        05/12/21 1111    naproxen (NAPROSYN) 500 MG tablet  2 times daily with meals        05/12/21 1111    pantoprazole (PROTONIX) 20 MG tablet  Daily        05/12/21 1111             Eber Hong, MD 05/12/21 1112

## 2021-05-12 NOTE — Discharge Instructions (Addendum)
Your testing today was reassuring, please drink plenty of clear liquids, get lots of rest, naproxen twice a day for headache or other pain, pantoprazole every day to help treat stomach ulcers or gastritis and Zofran every 6 hours as needed for nausea or vomiting.  Emergency department for worsening symptoms

## 2021-05-12 NOTE — ED Notes (Signed)
Pt alert, NAD, calm, interactive, c/o epigastric pain, also HA, NV. No emesis noted. Mother at [facility]. Pending lab results.

## 2021-09-01 IMAGING — US US ABDOMEN LIMITED
1 series · 14 of 25 positions shown · non-contrast
Comparison: Ultrasound 08/14/2009

CLINICAL DATA: Epigastric abdominal pain today

EXAM:
ULTRASOUND ABDOMEN LIMITED RIGHT UPPER QUADRANT

[Series 1: us abdomen limited · 14 of 56 slices shown]
[im 1/56]
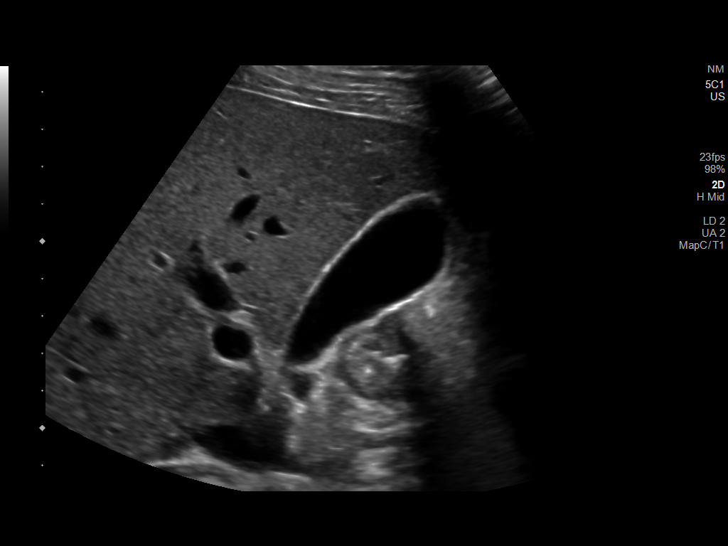
[im 5/56]
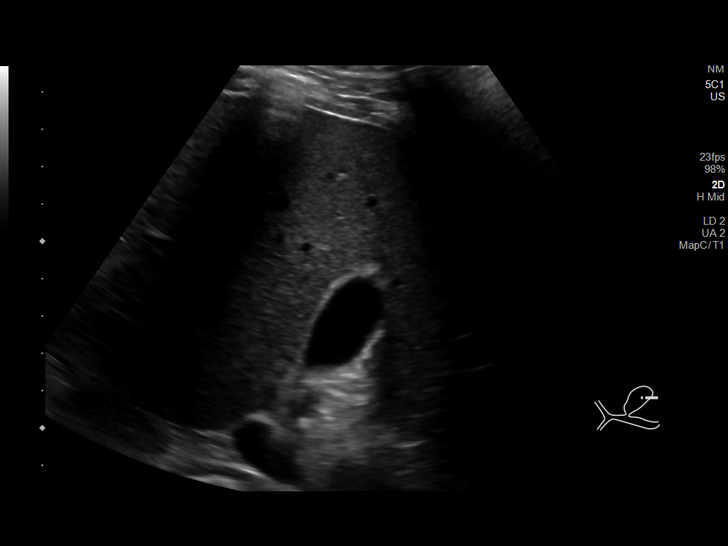
[im 10/56]
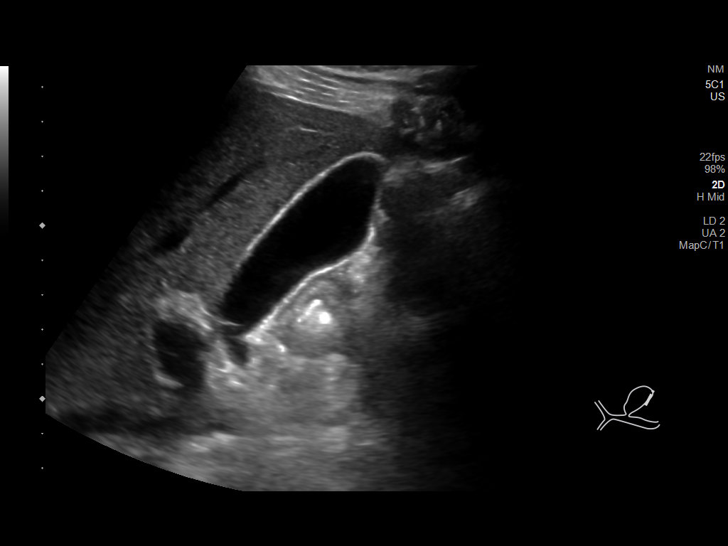
[im 14/56]
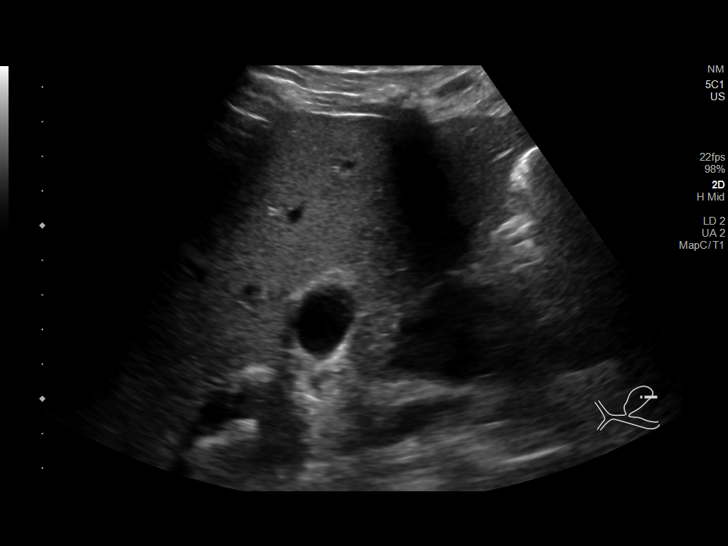
[im 19/56]
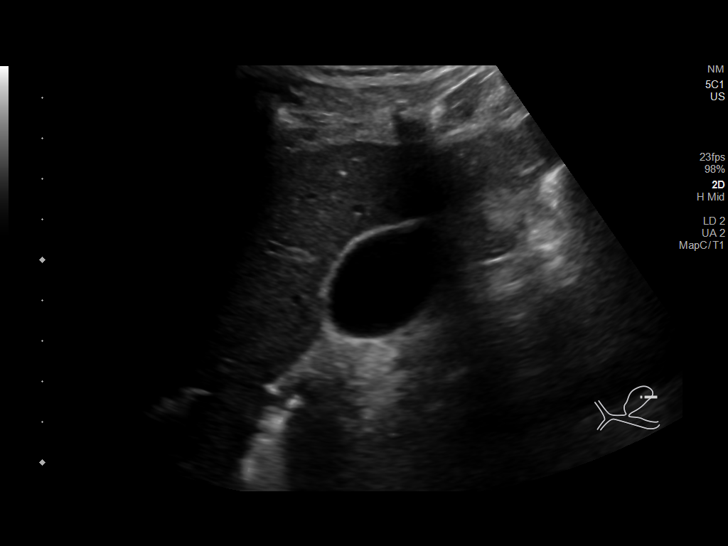
[im 21/56]
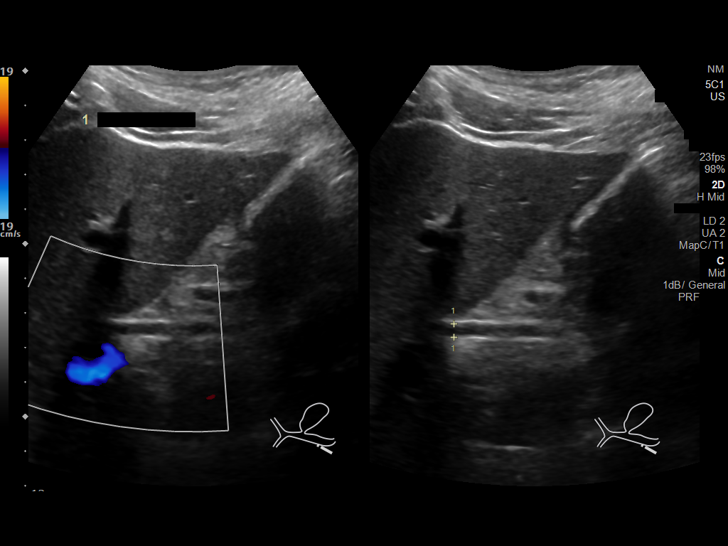
[im 26/56]
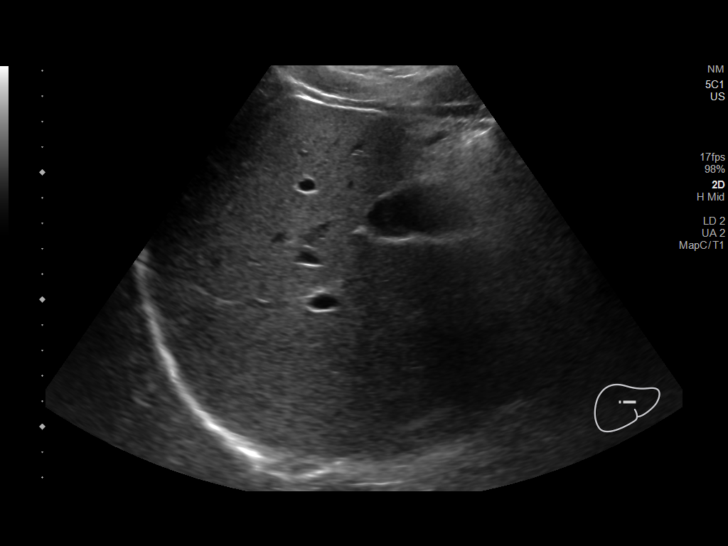
[im 30/56]
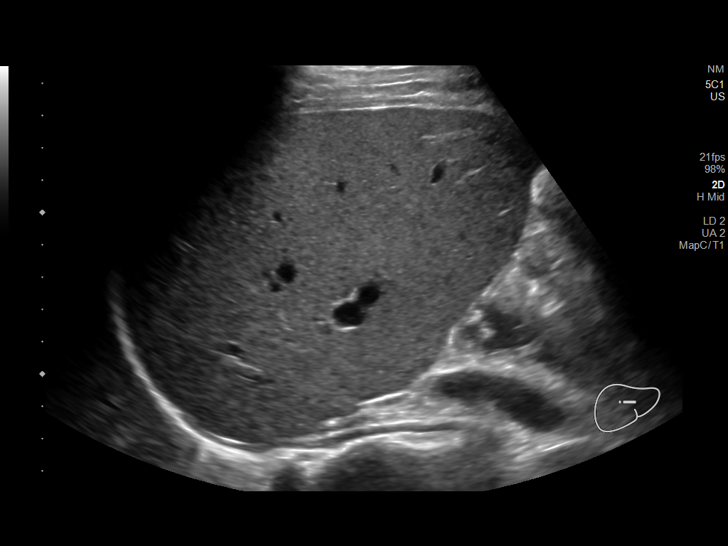
[im 35/56]
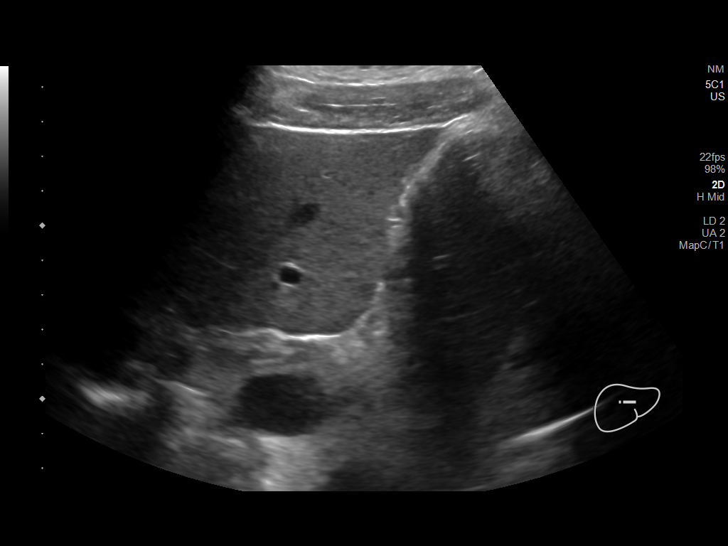
[im 37/56]
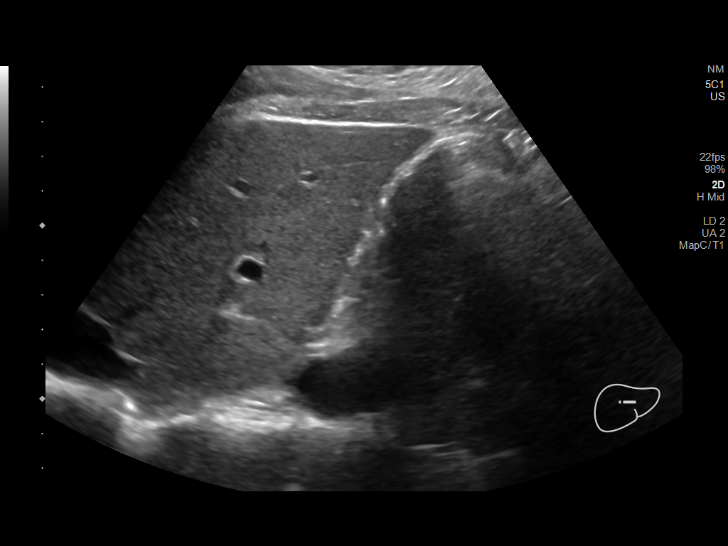
[im 42/56]
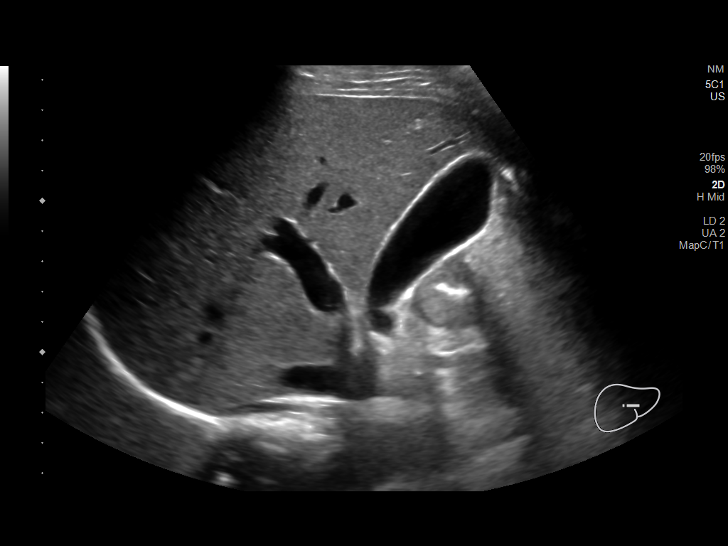
[im 46/56]
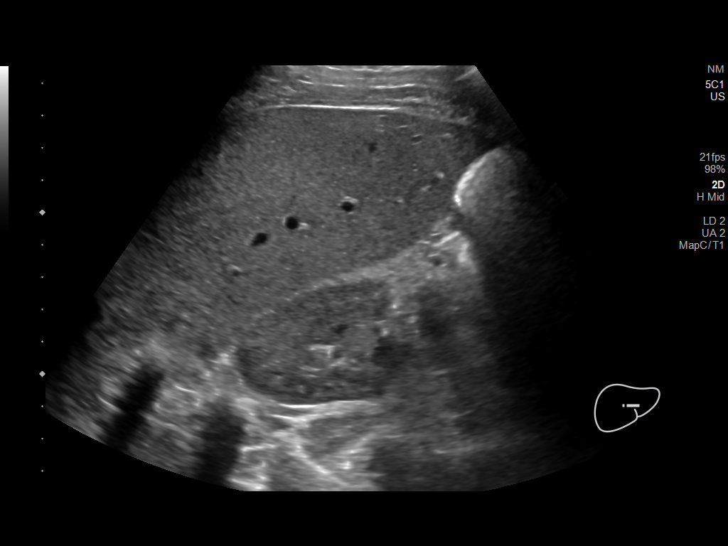
[im 51/56]
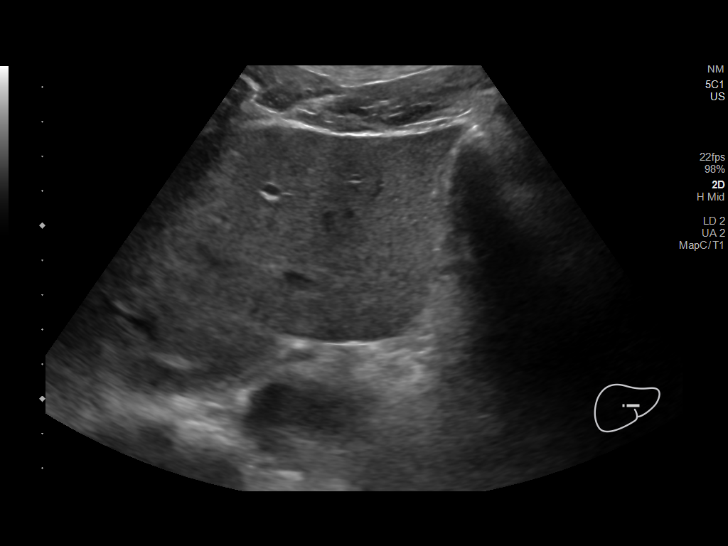
[im 56/56]
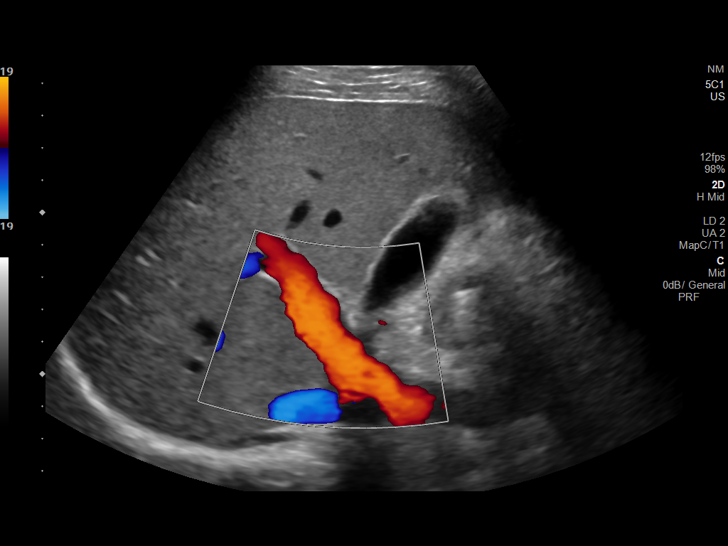

[14 of 25 positions shown; findings below may reference images not displayed]

FINDINGS: Gallbladder:

No gallstones or wall thickening visualized. No sonographic Murphy
sign noted by sonographer.

Common bile duct:

Diameter: 3.9 proximal, 3.6 distal.  Nondilated.

Liver:

No focal lesion identified. Within normal limits in parenchymal
echogenicity. Portal vein is patent on color Doppler imaging with
normal direction of blood flow towards the liver.

Other: None.
IMPRESSION: Unremarkable right upper quadrant ultrasound.

## 2023-12-01 ENCOUNTER — Emergency Department (HOSPITAL_COMMUNITY)
Admission: EM | Admit: 2023-12-01 | Discharge: 2023-12-02 | Disposition: A | Discharge status: Home / Self Care | Payer: Self-pay | Attending: Emergency Medicine | Admitting: Emergency Medicine

## 2023-12-01 ENCOUNTER — Other Ambulatory Visit: Payer: Self-pay

## 2023-12-01 ENCOUNTER — Emergency Department (HOSPITAL_COMMUNITY): Payer: Self-pay

## 2023-12-01 ENCOUNTER — Encounter (HOSPITAL_COMMUNITY): Payer: Self-pay

## 2023-12-01 DIAGNOSIS — R1013 Epigastric pain: Secondary | ICD-10-CM | POA: Diagnosis present

## 2023-12-01 DIAGNOSIS — K2980 Duodenitis without bleeding: Secondary | ICD-10-CM | POA: Insufficient documentation

## 2023-12-01 LAB — URINALYSIS, ROUTINE W REFLEX MICROSCOPIC
Bilirubin Urine: NEGATIVE
Glucose, UA: 50 mg/dL — AB
Hgb urine dipstick: NEGATIVE
Ketones, ur: NEGATIVE mg/dL
Leukocytes,Ua: NEGATIVE
Nitrite: NEGATIVE
Protein, ur: NEGATIVE mg/dL
Specific Gravity, Urine: >1.046 — ABNORMAL HIGH (ref 1.005–1.030)
pH: 8 (ref 5.0–8.0)

## 2023-12-01 LAB — CBC
HCT: 35.4 % — ABNORMAL LOW (ref 39.0–52.0)
Hemoglobin: 10.8 g/dL — ABNORMAL LOW (ref 13.0–17.0)
MCH: 22.7 pg — ABNORMAL LOW (ref 26.0–34.0)
MCHC: 30.5 g/dL (ref 30.0–36.0)
MCV: 74.5 fL — ABNORMAL LOW (ref 80.0–100.0)
Platelets: 428 10*3/uL — ABNORMAL HIGH (ref 150–400)
RBC: 4.75 MIL/uL (ref 4.22–5.81)
RDW: 20.5 % — ABNORMAL HIGH (ref 11.5–15.5)
WBC: 10.4 10*3/uL (ref 4.0–10.5)
nRBC: 0 % (ref 0.0–0.2)

## 2023-12-01 LAB — COMPREHENSIVE METABOLIC PANEL
ALT: 14 U/L (ref 0–44)
AST: 17 U/L (ref 15–41)
Albumin: 4 g/dL (ref 3.5–5.0)
Alkaline Phosphatase: 61 U/L (ref 38–126)
Anion gap: 9 (ref 5–15)
BUN: 8 mg/dL (ref 6–20)
CO2: 32 mmol/L (ref 22–32)
Calcium: 10.6 mg/dL — ABNORMAL HIGH (ref 8.9–10.3)
Chloride: 98 mmol/L (ref 98–111)
Creatinine, Ser: 1.05 mg/dL (ref 0.61–1.24)
GFR, Estimated: >60 mL/min (ref >60)
Glucose, Bld: 116 mg/dL — ABNORMAL HIGH (ref 70–99)
Potassium: 3.2 mmol/L — ABNORMAL LOW (ref 3.5–5.1)
Sodium: 139 mmol/L (ref 135–145)
Total Bilirubin: 0.6 mg/dL (ref 0.0–1.2)
Total Protein: 7.8 g/dL (ref 6.5–8.1)

## 2023-12-01 LAB — LIPASE, BLOOD: Lipase: 25 U/L (ref 11–51)

## 2023-12-01 MED ORDER — ALUM & MAG HYDROXIDE-SIMETH 200-200-20 MG/5ML PO SUSP
30.0000 mL | Freq: Once | ORAL | Status: AC
  Administered 2023-12-01: 30 mL via ORAL
  Filled 2023-12-01: qty 30

## 2023-12-01 MED ORDER — ONDANSETRON 4 MG PO TBDP: 4.0000 mg | ORAL_TABLET | Freq: Three times a day (TID) | ORAL | 0 refills | Status: DC | PRN

## 2023-12-01 MED ORDER — IOHEXOL 350 MG/ML SOLN
75.0000 mL | Freq: Once | INTRAVENOUS | Status: AC | PRN
  Administered 2023-12-01: 75 mL via INTRAVENOUS

## 2023-12-01 MED ORDER — PANTOPRAZOLE SODIUM 40 MG IV SOLR
80.0000 mg | Freq: Once | INTRAVENOUS | Status: AC
  Administered 2023-12-01: 80 mg via INTRAVENOUS
  Filled 2023-12-01: qty 20

## 2023-12-01 MED ORDER — OXYCODONE-ACETAMINOPHEN 5-325 MG PO TABS
2.0000 | ORAL_TABLET | Freq: Once | ORAL | Status: AC
  Administered 2023-12-01: 2 via ORAL
  Filled 2023-12-01: qty 2

## 2023-12-01 MED ORDER — ONDANSETRON 4 MG PO TBDP
4.0000 mg | ORAL_TABLET | Freq: Once | ORAL | Status: AC | PRN
  Administered 2023-12-01: 4 mg via ORAL
  Filled 2023-12-01: qty 1

## 2023-12-01 MED ORDER — PANTOPRAZOLE SODIUM 20 MG PO TBEC: 40.0000 mg | DELAYED_RELEASE_TABLET | Freq: Every day | ORAL | 0 refills | Status: DC

## 2023-12-01 NOTE — ED Provider Notes (Signed)
[place] EMERGENCY DEPARTMENT AT [facility] Provider Note   CSN: [id number] Arrival date & time: 12/01/23  1444     History {Add pertinent medical, surgical, social history, OB history to HPI:1} Chief Complaint  Patient presents with   Abdominal Pain    Joshua Gutierrez is a 38 y.o. male.  HPI     Epigastric pain, 2 years ago had bleeding from ulcer Pain since then has been coming and going, used to be once every 2-3 months, then would improve. For the last month had it 2 weeks ago, stopped then started again. Yesterday pain started again.  Stays in the middle sometimes will feel it radiating upwards.  Nausea and vomiting. No blood in emesis.  Every few hours vomiting since yesterday.  No diarrhea, constipation black or bloody stools. Yesterday had normal BM.  No known fever.  No cough other than after emesis.  No urinary symptoms.   Has not been taking ibuprofen recently, has not taken aleve or other NSAIDs No etoh use, no other drugs.    Prior eval was in CT Does not have GI Does not have PCP here   Past Medical History:  Diagnosis Date   Gastric ulcer    History reviewed. No pertinent surgical history.   Home Medications Prior to Admission medications   Medication Sig Start Date End Date Taking? Authorizing Provider  hydroxypropyl methylcellulose / hypromellose (ISOPTO TEARS / GONIOVISC) 2.5 % ophthalmic solution Place 1 drop into the left eye as needed for dry eyes. 05/10/21   Roxy Horseman, PA-C  naproxen (NAPROSYN) 500 MG tablet Take 1 tablet (500 mg total) by mouth 2 (two) times daily with a meal. 05/12/21   Eber Hong, MD  ondansetron (ZOFRAN) 4 MG tablet Take 1 tablet (4 mg total) by mouth every 6 (six) hours. 05/12/21   Eber Hong, MD  pantoprazole (PROTONIX) 20 MG tablet Take 1 tablet (20 mg total) by mouth daily. 05/12/21 06/11/21  Eber Hong, MD  predniSONE (DELTASONE) 20 MG tablet Take 2 tablets (40 mg total) by mouth daily. 05/10/21   Roxy Horseman, PA-C  valACYclovir (VALTREX) 1000 MG tablet Take 1 tablet (1,000 mg total) by mouth 3 (three) times daily. 05/10/21   Roxy Horseman, PA-C  diphenhydrAMINE (BENADRYL) 25 MG tablet Take 1 tablet (25 mg total) by mouth every 8 (eight) hours as needed for itching. 07/16/15 07/11/19  Linwood Dibbles, MD      Allergies    Zolpidem    Review of Systems   Review of Systems  Physical Exam Updated Vital Signs BP (!) 134/95   Pulse (!) 106   Temp 98.3 F (36.8 C) (Oral)   Resp 20   Ht 5\' 6"  (1.676 m)   Wt 68 kg   SpO2 100%   BMI 24.20 kg/m  Physical Exam  ED Results / Procedures / Treatments   Labs (all labs ordered are listed, but only abnormal results are displayed) Labs Reviewed  COMPREHENSIVE METABOLIC PANEL - Abnormal; Notable for the following components:      Result Value   Potassium 3.2 (*)    Glucose, Bld 116 (*)    Calcium 10.6 (*)    All other components within normal limits  CBC - Abnormal; Notable for the following components:   Hemoglobin 10.8 (*)    HCT 35.4 (*)    MCV 74.5 (*)    MCH 22.7 (*)    RDW 20.5 (*)    Platelets 428 (*)  All other components within normal limits  LIPASE, BLOOD  URINALYSIS, ROUTINE W REFLEX MICROSCOPIC    EKG None  Radiology No results found.  Procedures Procedures  {Document cardiac monitor, telemetry assessment procedure when appropriate:1}  Medications Ordered in ED Medications  ondansetron (ZOFRAN-ODT) disintegrating tablet 4 mg (4 mg Oral Given 12/01/23 1602)  oxyCODONE-acetaminophen (PERCOCET/ROXICET) 5-325 MG per tablet 2 tablet (2 tablets Oral Given 12/01/23 1556)    ED Course/ Medical Decision Making/ A&P   {   Click here for ABCD2, HEART and other calculatorsREFRESH Note before signing :1}                              Medical Decision Making Amount and/or Complexity of Data Reviewed Labs: ordered.  Risk Prescription drug management.   ***  {Document critical care time when  appropriate:1} {Document review of labs and clinical decision tools ie heart score, Chads2Vasc2 etc:1}  {Document your independent review of radiology images, and any outside records:1} {Document your discussion with family members, caretakers, and with consultants:1} {Document social determinants of health affecting pt's care:1} {Document your decision making why or why not admission, treatments were needed:1} Final Clinical Impression(s) / ED Diagnoses Final diagnoses:  None    Rx / DC Orders ED Discharge Orders     None

## 2023-12-01 NOTE — ED Triage Notes (Addendum)
Pt to ED via [facility] from home. Pt c/o abdominal pain that is similar to when he had ulcers before.   Pt states he usually has abdominal pain but it got worse yesterday. Pt has had vomiting. Pt denies diarrhea or constipation.  EMS VS 140/90 102bpm 98% 98.2 Cbg 124 18g LAC

## 2023-12-01 NOTE — ED Notes (Signed)
 Pt unable to provide urine sample at this time

## 2023-12-01 NOTE — ED Provider Triage Note (Signed)
Emergency Medicine Provider Triage Evaluation Note  Joshua Gutierrez , a 38 y.o. male  was evaluated in triage.  Pt complains of epigastric abdominal pain.  Started yesterday.  Severe.  History of gastric ulcers..  Review of Systems  Positive: Epigastric abdominal pain Negative: Fever  Physical Exam  BP (!) 134/95   Pulse (!) 106   Temp 98.3 F (36.8 C) (Oral)   Resp 20   Ht 5\' 6"  (1.676 m)   Wt 68 kg   SpO2 100%   BMI 24.20 kg/m  Gen:   Awake, no distress   Resp:  Normal effort  MSK:   Moves extremities without difficulty  Other:    Medical Decision Making  Medically screening exam initiated at 3:02 PM.  Appropriate orders placed.  Joshua Gutierrez was informed that the remainder of the evaluation will be completed by another provider, this initial triage assessment does not replace that evaluation, and the importance of remaining in the ED until their evaluation is complete.     Arthor Captain, PA-C 12/01/23 8295

## 2024-04-23 ENCOUNTER — Emergency Department (HOSPITAL_COMMUNITY): Payer: Self-pay

## 2024-04-23 ENCOUNTER — Encounter (HOSPITAL_COMMUNITY): Payer: Self-pay

## 2024-04-23 ENCOUNTER — Inpatient Hospital Stay (HOSPITAL_COMMUNITY)
Admission: EM | Admit: 2024-04-23 | Discharge: 2024-04-27 | DRG: 378 | Disposition: A | Discharge status: Home / Self Care | Payer: Self-pay | Attending: Internal Medicine | Admitting: Internal Medicine

## 2024-04-23 ENCOUNTER — Other Ambulatory Visit: Payer: Self-pay

## 2024-04-23 DIAGNOSIS — D500 Iron deficiency anemia secondary to blood loss (chronic): Principal | ICD-10-CM | POA: Diagnosis present

## 2024-04-23 DIAGNOSIS — F17210 Nicotine dependence, cigarettes, uncomplicated: Secondary | ICD-10-CM | POA: Diagnosis present

## 2024-04-23 DIAGNOSIS — K922 Gastrointestinal hemorrhage, unspecified: Secondary | ICD-10-CM | POA: Diagnosis present

## 2024-04-23 DIAGNOSIS — K254 Chronic or unspecified gastric ulcer with hemorrhage: Principal | ICD-10-CM | POA: Diagnosis present

## 2024-04-23 DIAGNOSIS — Z888 Allergy status to other drugs, medicaments and biological substances status: Secondary | ICD-10-CM

## 2024-04-23 DIAGNOSIS — Z79899 Other long term (current) drug therapy: Secondary | ICD-10-CM

## 2024-04-23 DIAGNOSIS — K2971 Gastritis, unspecified, with bleeding: Secondary | ICD-10-CM | POA: Diagnosis present

## 2024-04-23 DIAGNOSIS — D62 Acute posthemorrhagic anemia: Secondary | ICD-10-CM | POA: Diagnosis present

## 2024-04-23 LAB — ETHANOL: Alcohol, Ethyl (B): <15 mg/dL (ref <15)

## 2024-04-23 LAB — CBC WITH DIFFERENTIAL/PLATELET
Abs Immature Granulocytes: 0.07 10*3/uL (ref 0.00–0.07)
Basophils Absolute: 0.1 10*3/uL (ref 0.0–0.1)
Basophils Relative: 1 %
Eosinophils Absolute: 0.2 10*3/uL (ref 0.0–0.5)
Eosinophils Relative: 2 %
HCT: 23.4 % — ABNORMAL LOW (ref 39.0–52.0)
Hemoglobin: 6.5 g/dL — CL (ref 13.0–17.0)
Immature Granulocytes: 1 %
Lymphocytes Relative: 12 %
Lymphs Abs: 1.7 10*3/uL (ref 0.7–4.0)
MCH: 19.6 pg — ABNORMAL LOW (ref 26.0–34.0)
MCHC: 27.8 g/dL — ABNORMAL LOW (ref 30.0–36.0)
MCV: 70.5 fL — ABNORMAL LOW (ref 80.0–100.0)
Monocytes Absolute: 0.5 10*3/uL (ref 0.1–1.0)
Monocytes Relative: 4 %
Neutro Abs: 11 10*3/uL — ABNORMAL HIGH (ref 1.7–7.7)
Neutrophils Relative %: 80 %
Platelets: 294 10*3/uL (ref 150–400)
RBC: 3.32 MIL/uL — ABNORMAL LOW (ref 4.22–5.81)
RDW: 17.2 % — ABNORMAL HIGH (ref 11.5–15.5)
WBC: 13.6 10*3/uL — ABNORMAL HIGH (ref 4.0–10.5)
nRBC: 0 % (ref 0.0–0.2)

## 2024-04-23 LAB — COMPREHENSIVE METABOLIC PANEL WITH GFR
ALT: 15 U/L (ref 0–44)
AST: 17 U/L (ref 15–41)
Albumin: 3.4 g/dL — ABNORMAL LOW (ref 3.5–5.0)
Alkaline Phosphatase: 51 U/L (ref 38–126)
Anion gap: 7 (ref 5–15)
BUN: 32 mg/dL — ABNORMAL HIGH (ref 6–20)
CO2: 25 mmol/L (ref 22–32)
Calcium: 8.3 mg/dL — ABNORMAL LOW (ref 8.9–10.3)
Chloride: 105 mmol/L (ref 98–111)
Creatinine, Ser: 0.79 mg/dL (ref 0.61–1.24)
GFR, Estimated: >60 mL/min (ref >60)
Glucose, Bld: 112 mg/dL — ABNORMAL HIGH (ref 70–99)
Potassium: 4.3 mmol/L (ref 3.5–5.1)
Sodium: 137 mmol/L (ref 135–145)
Total Bilirubin: 0.3 mg/dL (ref 0.0–1.2)
Total Protein: 6.4 g/dL — ABNORMAL LOW (ref 6.5–8.1)

## 2024-04-23 LAB — HEMOGLOBIN AND HEMATOCRIT, BLOOD
HCT: 25.8 % — ABNORMAL LOW (ref 39.0–52.0)
Hemoglobin: 7.6 g/dL — ABNORMAL LOW (ref 13.0–17.0)

## 2024-04-23 LAB — HIV ANTIBODY (ROUTINE TESTING W REFLEX): HIV Screen 4th Generation wRfx: NONREACTIVE

## 2024-04-23 LAB — ABO/RH: ABO/RH(D): O POS

## 2024-04-23 LAB — PREPARE RBC (CROSSMATCH)

## 2024-04-23 LAB — POC OCCULT BLOOD, ED: Fecal Occult Bld: POSITIVE — AB

## 2024-04-23 MED ORDER — PANTOPRAZOLE SODIUM 40 MG IV SOLR
40.0000 mg | Freq: Two times a day (BID) | INTRAVENOUS | Status: DC
  Administered 2024-04-23 – 2024-04-24 (×2): 40 mg via INTRAVENOUS
  Filled 2024-04-23 (×2): qty 10

## 2024-04-23 MED ORDER — MORPHINE SULFATE (PF) 2 MG/ML IV SOLN
2.0000 mg | INTRAVENOUS | Status: DC | PRN
  Administered 2024-04-23 – 2024-04-24 (×4): 2 mg via INTRAVENOUS
  Filled 2024-04-23 (×4): qty 1

## 2024-04-23 MED ORDER — SODIUM CHLORIDE 0.9% IV SOLUTION: Freq: Once | INTRAVENOUS | Status: AC

## 2024-04-23 MED ORDER — PANTOPRAZOLE SODIUM 40 MG IV SOLR
80.0000 mg | Freq: Once | INTRAVENOUS | Status: AC
  Administered 2024-04-23: 80 mg via INTRAVENOUS
  Filled 2024-04-23: qty 20

## 2024-04-23 MED ORDER — ACETAMINOPHEN 325 MG PO TABS: 650.0000 mg | ORAL_TABLET | Freq: Four times a day (QID) | ORAL | Status: DC | PRN

## 2024-04-23 MED ORDER — PANTOPRAZOLE SODIUM 40 MG IV SOLR: 40.0000 mg | Freq: Two times a day (BID) | INTRAVENOUS | Status: DC

## 2024-04-23 MED ORDER — ACETAMINOPHEN 650 MG RE SUPP: 650.0000 mg | Freq: Four times a day (QID) | RECTAL | Status: DC | PRN

## 2024-04-23 MED ORDER — ONDANSETRON HCL 4 MG PO TABS: 4.0000 mg | ORAL_TABLET | Freq: Four times a day (QID) | ORAL | Status: DC | PRN

## 2024-04-23 MED ORDER — ALBUTEROL SULFATE (2.5 MG/3ML) 0.083% IN NEBU: 2.5000 mg | INHALATION_SOLUTION | RESPIRATORY_TRACT | Status: DC | PRN

## 2024-04-23 MED ORDER — TRAZODONE HCL 50 MG PO TABS: 25.0000 mg | ORAL_TABLET | Freq: Every evening | ORAL | Status: DC | PRN

## 2024-04-23 MED ORDER — PANTOPRAZOLE SODIUM 40 MG IV SOLR: 40.0000 mg | INTRAVENOUS | Status: DC

## 2024-04-23 MED ORDER — ONDANSETRON HCL 4 MG/2ML IJ SOLN
4.0000 mg | Freq: Four times a day (QID) | INTRAMUSCULAR | Status: DC | PRN
  Administered 2024-04-23 (×2): 4 mg via INTRAVENOUS
  Filled 2024-04-23 (×2): qty 2

## 2024-04-23 NOTE — ED Notes (Signed)
 Blood bank has a unit of blood ready for this patient, notified, Garrel, Charity fundraiser.

## 2024-04-23 NOTE — H&P (View-Only) (Signed)
 Referring Provider: Dr. Zella Primary Care Physician:  Patient, No Pcp Per Primary Gastroenterologist: Sampson  Reason for Consultation:  Melena  HPI: Joshua Gutierrez is a 38 y.o. male with history of peptic ulcer disease (pt does not know details and no records to confirm) who was found down today and had soiled himself with a dark tarry stool. Has been having epigastric pain especially after eating with nausea without vomiting. Black stools in ER. Hgb Denies NSAIDs. Drinks alcohol but unable to tell me how much. Hgb 6.5.  Past Medical History:  Diagnosis Date   Gastric ulcer     History reviewed. No pertinent surgical history.  Prior to Admission medications   Medication Sig Start Date End Date Taking? Authorizing Provider  ondansetron  (ZOFRAN -ODT) 4 MG disintegrating tablet Take 1 tablet (4 mg total) by mouth every 8 (eight) hours as needed for nausea or vomiting. 12/01/23   Dreama Longs, MD  pantoprazole  (PROTONIX ) 20 MG tablet Take 2 tablets (40 mg total) by mouth daily. 12/01/23 12/31/23  Dreama Longs, MD  diphenhydrAMINE  (BENADRYL ) 25 MG tablet Take 1 tablet (25 mg total) by mouth every 8 (eight) hours as needed for itching. 07/16/15 07/11/19  Randol Simmonds, MD    Scheduled Meds:  NOREEN ON 04/24/2024] pantoprazole  (PROTONIX ) IV  40 mg Intravenous Q24H   Continuous Infusions: PRN Meds:.acetaminophen  **OR** acetaminophen , albuterol, ondansetron  **OR** ondansetron  (ZOFRAN ) IV, traZODone  Allergies as of 04/23/2024 - Review Complete 04/23/2024  Allergen Reaction Noted   Ambien [zolpidem] Other (See Comments) 11/13/2013    History reviewed. No pertinent family history.  Social History   Socioeconomic History   Marital status: Single    Spouse name: Not on file   Number of children: Not on file   Years of education: Not on file   Highest education level: Not on file  Occupational History   Not on file  Tobacco Use   Smoking status: Every Day    Current packs/day:  0.50    Types: Cigarettes   Smokeless tobacco: Never  Vaping Use   Vaping status: Never Used  Substance and Sexual Activity   Alcohol use: Yes    Comment: occasionally   Drug use: No   Sexual activity: Not on file  Other Topics Concern   Not on file  Social History Narrative   Not on file   Social Drivers of Health   Financial Resource Strain: Not on file  Food Insecurity: Not on file  Transportation Needs: Not on file  Physical Activity: Not on file  Stress: Not on file  Social Connections: Not on file  Intimate Partner Violence: Not on file    Review of Systems: All negative except as stated above in HPI.  Physical Exam: Vital signs: Vitals:   04/23/24 1230 04/23/24 1412  BP: (!) 148/93 110/75  Pulse: 81 96  Resp: 16 18  Temp:  97.9 F (36.6 C)  SpO2: 100%      General: Chronically ill-appearing, thin, no acute distress   Head: normocephalic, atraumatic Eyes: anicteric sclera ENT: oropharynx clear Neck: supple, nontender Lungs:  Clear throughout to auscultation.   No wheezes, crackles, or rhonchi. No acute distress. Heart:  Regular rate and rhythm; no murmurs, clicks, rubs,  or gallops. Abdomen: soft, nontender, nondistended, +BS  Rectal:  Deferred Ext: no edema  GI:  Lab Results: Recent Labs    04/23/24 1103  WBC 13.6*  HGB 6.5*  HCT 23.4*  PLT 294   BMET Recent Labs    04/23/24  1103  NA 137  K 4.3  CL 105  CO2 25  GLUCOSE 112*  BUN 32*  CREATININE 0.79  CALCIUM 8.3*   LFT Recent Labs    04/23/24 1103  PROT 6.4*  ALBUMIN 3.4*  AST 17  ALT 15  ALKPHOS 51  BILITOT 0.3   PT/INR No results for input(s): LABPROT, INR in the last 72 hours.   Studies/Results:  Impression/Plan: Melena concerning for peptic ulcer bleed - IV PPI Q 12. NPO except ice chips and sips of clears. EGD tomorrow to further evaluate. Supportive care.     LOS: 0 days   Joshua Gutierrez  04/23/2024, 2:27 PM  Questions please call [phone]

## 2024-04-23 NOTE — Consult Note (Signed)
 Referring Provider: Dr. Zella Primary Care Physician:  Patient, No Pcp Per Primary Gastroenterologist: Sampson  Reason for Consultation:  Melena  HPI: Joshua Gutierrez is a 38 y.o. male with history of peptic ulcer disease (pt does not know details and no records to confirm) who was found down today and had soiled himself with a dark tarry stool. Has been having epigastric pain especially after eating with nausea without vomiting. Black stools in ER. Hgb Denies NSAIDs. Drinks alcohol but unable to tell me how much. Hgb 6.5.  Past Medical History:  Diagnosis Date   Gastric ulcer     History reviewed. No pertinent surgical history.  Prior to Admission medications   Medication Sig Start Date End Date Taking? Authorizing Provider  ondansetron  (ZOFRAN -ODT) 4 MG disintegrating tablet Take 1 tablet (4 mg total) by mouth every 8 (eight) hours as needed for nausea or vomiting. 12/01/23   Dreama Longs, MD  pantoprazole  (PROTONIX ) 20 MG tablet Take 2 tablets (40 mg total) by mouth daily. 12/01/23 12/31/23  Dreama Longs, MD  diphenhydrAMINE  (BENADRYL ) 25 MG tablet Take 1 tablet (25 mg total) by mouth every 8 (eight) hours as needed for itching. 07/16/15 07/11/19  Randol Simmonds, MD    Scheduled Meds:  NOREEN ON 04/24/2024] pantoprazole  (PROTONIX ) IV  40 mg Intravenous Q24H   Continuous Infusions: PRN Meds:.acetaminophen  **OR** acetaminophen , albuterol, ondansetron  **OR** ondansetron  (ZOFRAN ) IV, traZODone  Allergies as of 04/23/2024 - Review Complete 04/23/2024  Allergen Reaction Noted   Ambien [zolpidem] Other (See Comments) 11/13/2013    History reviewed. No pertinent family history.  Social History   Socioeconomic History   Marital status: Single    Spouse name: Not on file   Number of children: Not on file   Years of education: Not on file   Highest education level: Not on file  Occupational History   Not on file  Tobacco Use   Smoking status: Every Day    Current packs/day:  0.50    Types: Cigarettes   Smokeless tobacco: Never  Vaping Use   Vaping status: Never Used  Substance and Sexual Activity   Alcohol use: Yes    Comment: occasionally   Drug use: No   Sexual activity: Not on file  Other Topics Concern   Not on file  Social History Narrative   Not on file   Social Drivers of Health   Financial Resource Strain: Not on file  Food Insecurity: Not on file  Transportation Needs: Not on file  Physical Activity: Not on file  Stress: Not on file  Social Connections: Not on file  Intimate Partner Violence: Not on file    Review of Systems: All negative except as stated above in HPI.  Physical Exam: Vital signs: Vitals:   04/23/24 1230 04/23/24 1412  BP: (!) 148/93 110/75  Pulse: 81 96  Resp: 16 18  Temp:  97.9 F (36.6 C)  SpO2: 100%      General: Chronically ill-appearing, thin, no acute distress   Head: normocephalic, atraumatic Eyes: anicteric sclera ENT: oropharynx clear Neck: supple, nontender Lungs:  Clear throughout to auscultation.   No wheezes, crackles, or rhonchi. No acute distress. Heart:  Regular rate and rhythm; no murmurs, clicks, rubs,  or gallops. Abdomen: soft, nontender, nondistended, +BS  Rectal:  Deferred Ext: no edema  GI:  Lab Results: Recent Labs    04/23/24 1103  WBC 13.6*  HGB 6.5*  HCT 23.4*  PLT 294   BMET Recent Labs    04/23/24  1103  NA 137  K 4.3  CL 105  CO2 25  GLUCOSE 112*  BUN 32*  CREATININE 0.79  CALCIUM 8.3*   LFT Recent Labs    04/23/24 1103  PROT 6.4*  ALBUMIN 3.4*  AST 17  ALT 15  ALKPHOS 51  BILITOT 0.3   PT/INR No results for input(s): LABPROT, INR in the last 72 hours.   Studies/Results:  Impression/Plan: Melena concerning for peptic ulcer bleed - IV PPI Q 12. NPO except ice chips and sips of clears. EGD tomorrow to further evaluate. Supportive care.     LOS: 0 days   Jerrell [name]  04/23/2024, 2:27 PM  Questions please call [phone]

## 2024-04-23 NOTE — H&P (Signed)
 History and Physical  Elih Mooney FMW:[id number] DOB: [birth year] DOA: 04/23/2024  PCP: Patient, No Pcp Per   Chief Complaint: Abdominal pain, black stools  HPI: Zyion Doxtater is a 38 y.o. male with medical history significant for peptic ulcer disease and Bell's palsy being admitted to the hospital with acute blood loss anemia and suspected upper GI bleed.  He does have a known history of gastric ulcer, there is unclear to me if he has ever had upper endoscopy.  States that for the last few days he has been having epigastric abdominal pain worse with eating, and associated nausea without vomiting.  Apparently today he was found down at a friend's house, having defecated on himself dark tarry stool.  Here in the emergency department, was found to have hemoglobin of 6.5 down significantly from last value of 11 in January of this year.  He denies taking any blood thinners, does not take any regular medications.  Review of Systems: Please see HPI for pertinent positives and negatives. A complete 10 system review of systems are otherwise negative.  Past Medical History:  Diagnosis Date   Gastric ulcer    History reviewed. No pertinent surgical history. Social History:  reports that he has been smoking cigarettes. He has never used smokeless tobacco. He reports current alcohol use. He reports that he does not use drugs.  Allergies  Allergen Reactions   Ambien [Zolpidem] Other (See Comments)    Seizures Dizziness     History reviewed. No pertinent family history.   Prior to Admission medications   Medication Sig Start Date End Date Taking? Authorizing Provider  ondansetron  (ZOFRAN -ODT) 4 MG disintegrating tablet Take 1 tablet (4 mg total) by mouth every 8 (eight) hours as needed for nausea or vomiting. 12/01/23   Dreama Longs, MD  pantoprazole  (PROTONIX ) 20 MG tablet Take 2 tablets (40 mg total) by mouth daily. 12/01/23 12/31/23  Dreama Longs, MD  diphenhydrAMINE  (BENADRYL ) 25 MG tablet Take 1  tablet (25 mg total) by mouth every 8 (eight) hours as needed for itching. 07/16/15 07/11/19  Randol Simmonds, MD    Physical Exam: BP 110/75   Pulse 96   Temp 97.9 F (36.6 C) (Oral)   Resp 18   SpO2 100%  General:  Alert, oriented, calm, in no acute distress  Eyes: EOMI, clear conjuctivae, white sclerea Neck: supple, no masses, trachea mildline  Cardiovascular: RRR, no murmurs or rubs, no peripheral edema  Respiratory: clear to auscultation bilaterally, no wheezes, no crackles  Abdomen: soft, nontender, nondistended, normal bowel tones heard  Skin: dry, no rashes  Musculoskeletal: no joint effusions, normal range of motion  Psychiatric: appropriate affect, normal speech  Neurologic: extraocular muscles intact, clear speech, moving all extremities with intact sensorium         Labs on Admission:  Basic Metabolic Panel: Recent Labs  Lab 04/23/24 1103  NA 137  K 4.3  CL 105  CO2 25  GLUCOSE 112*  BUN 32*  CREATININE 0.79  CALCIUM 8.3*   Liver Function Tests: Recent Labs  Lab 04/23/24 1103  AST 17  ALT 15  ALKPHOS 51  BILITOT 0.3  PROT 6.4*  ALBUMIN 3.4*   No results for input(s): LIPASE, AMYLASE in the last 168 hours. No results for input(s): AMMONIA in the last 168 hours. CBC: Recent Labs  Lab 04/23/24 1103  WBC 13.6*  NEUTROABS 11.0*  HGB 6.5*  HCT 23.4*  MCV 70.5*  PLT 294   Cardiac Enzymes: No results for input(s): CKTOTAL,  CKMB, CKMBINDEX, TROPONINI in the last 168 hours. BNP (last 3 results) No results for input(s): BNP in the last 8760 hours.  ProBNP (last 3 results) No results for input(s): PROBNP in the last 8760 hours.  CBG: No results for input(s): GLUCAP in the last 168 hours.  Radiological Exams on Admission: CT Head Wo Contrast Result Date: 04/23/2024 CLINICAL DATA:  Seizure, new onset, found unresponsive. EXAM: CT HEAD WITHOUT CONTRAST CT CERVICAL SPINE WITHOUT CONTRAST TECHNIQUE: Multidetector CT imaging of the head  and cervical spine was performed following the standard protocol without intravenous contrast. Multiplanar CT image reconstructions of the cervical spine were also generated. RADIATION DOSE REDUCTION: This exam was performed according to the departmental dose-optimization program which includes automated exposure control, adjustment of the mA and/or kV according to patient size and/or use of iterative reconstruction technique. COMPARISON:  None Available. FINDINGS: CT HEAD FINDINGS Brain: No acute intracranial hemorrhage. No CT evidence of acute infarct. No edema, mass effect, or midline shift. The basilar cisterns are patent. Ventricles: The ventricles are normal. Vascular: No hyperdense vessel or unexpected calcification. Skull: No acute or aggressive finding. Orbits: Orbits are symmetric. Sinuses: Mild mucosal thickening in the ethmoid sinuses. Other: Mastoid air cells are clear. Subgaleal lipoma in the right frontal scalp. CT CERVICAL SPINE FINDINGS Alignment: Normal. Skull base and vertebrae: No acute fracture. No primary bone lesion or focal pathologic process. Soft tissues and spinal canal: No prevertebral fluid or swelling. No visible canal hematoma. Disc levels: Intervertebral disc spaces are maintained. Small disc bulges at multiple levels. There is mild spinal canal narrowing at C2-3 through C4-5. Central disc protrusion at C5-6 resulting in moderate spinal canal narrowing. No high-grade osseous foraminal stenosis. Upper chest: Negative. Other: None. IMPRESSION: No CT evidence of acute intracranial abnormality. No acute fracture or traumatic malalignment of the cervical spine. Degenerative changes of the cervical spine as above. 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 Electronically Signed   By: Donnice Mania M.D.   On: 04/23/2024 13:13   CT Cervical Spine Wo Contrast Result Date: 04/23/2024 CLINICAL DATA:  Seizure, new onset, found unresponsive. EXAM: CT HEAD WITHOUT  CONTRAST CT CERVICAL SPINE WITHOUT CONTRAST TECHNIQUE: Multidetector CT imaging of the head and cervical spine was performed following the standard protocol without intravenous contrast. Multiplanar CT image reconstructions of the cervical spine were also generated. RADIATION DOSE REDUCTION: This exam was performed according to the departmental dose-optimization program which includes automated exposure control, adjustment of the mA and/or kV according to patient size and/or use of iterative reconstruction technique. COMPARISON:  None Available. FINDINGS: CT HEAD FINDINGS Brain: No acute intracranial hemorrhage. No CT evidence of acute infarct. No edema, mass effect, or midline shift. The basilar cisterns are patent. Ventricles: The ventricles are normal. Vascular: No hyperdense vessel or unexpected calcification. Skull: No acute or aggressive finding. Orbits: Orbits are symmetric. Sinuses: Mild mucosal thickening in the ethmoid sinuses. Other: Mastoid air cells are clear. Subgaleal lipoma in the right frontal scalp. CT CERVICAL SPINE FINDINGS Alignment: Normal. Skull base and vertebrae: No acute fracture. No primary bone lesion or focal pathologic process. Soft tissues and spinal canal: No prevertebral fluid or swelling. No visible canal hematoma. Disc levels: Intervertebral disc spaces are maintained. Small disc bulges at multiple levels. There is mild spinal canal narrowing at C2-3 through C4-5. Central disc protrusion at C5-6 resulting in moderate spinal canal narrowing. No high-grade osseous foraminal stenosis. Upper chest: Negative. Other: None. IMPRESSION: No CT evidence of acute intracranial abnormality.  No acute fracture or traumatic malalignment of the cervical spine. Degenerative changes of the cervical spine as above. 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 Electronically Signed   By: Donnice Mania M.D.   On: 04/23/2024 13:13   Assessment/Plan Rahiem Schellinger is a 38 y.o. male  with medical history significant for peptic ulcer disease and Bell's palsy being admitted to the hospital with acute blood loss anemia and suspected upper GI bleed.   Acute blood loss anemia-with some dizziness/weakness, but denies chest pain.  Hemoglobin is down to 6.5 from 11 about 7 months ago.  Given his history of peptic ulcer disease, as well as guaiac positive stool today, this is likely due to upper GI bleeding from PUD. -Observation admission -Monitor on progressive unit -Transfuse 1 unit PRBC  PUD with suspected upper GI bleeding-patient is hemodynamically stable, receiving PRBC -IV PPI -Avoid blood thinners and NSAIDs -Clear liquid diet -ER has discussed with GI Dr. Dianna who will consult  DVT prophylaxis: SCDs only    Code Status: Full Code  Consults called: GI  Admission status: Observation  Time spent: 48 minutes  Daielle Melcher CHRISTELLA Gail MD [facility] Pager [phone]  If 7PM-7AM, please contact night-coverage [email or web address] Password [email or web address]  04/23/2024, 2:13 PM

## 2024-04-23 NOTE — ED Triage Notes (Signed)
 Pt bib [facility] from a friends home found unresponsive possible with history of seizures. Patient had defecated on himself. Non-compliant with medication. C-collar. Patient is not talking with ems at this time.

## 2024-04-23 NOTE — ED Provider Notes (Signed)
 [place] EMERGENCY DEPARTMENT AT [facility] Provider Note   CSN: [id number] Arrival date & time: 04/23/24  1051     Patient presents with: Loss of Consciousness   Joshua Gutierrez is a 38 y.o. male.  Patient reports past history of gastric ulcer and PUD.  Concerns of suspected seizure versus syncopal episode.  Patient was found unresponsive with a possible seizure.  Patient reports that he had defecated on himself but does not recall these events.  No reported head injury, head strike, or headache.  Feels pain in his upper abdomen consistent with prior PUD.  Not currently on PPIs.  Does not follow with GI.  Not on blood thinners. No reported heavy ASA or NSAID use. Denies alcohol or substance use.   Loss of Consciousness      Prior to Admission medications   Medication Sig Start Date End Date Taking? Authorizing Provider  ondansetron  (ZOFRAN -ODT) 4 MG disintegrating tablet Take 1 tablet (4 mg total) by mouth every 8 (eight) hours as needed for nausea or vomiting. 12/01/23   Dreama Longs, MD  pantoprazole  (PROTONIX ) 20 MG tablet Take 2 tablets (40 mg total) by mouth daily. 12/01/23 04/23/24  Dreama Longs, MD  diphenhydrAMINE  (BENADRYL ) 25 MG tablet Take 1 tablet (25 mg total) by mouth every 8 (eight) hours as needed for itching. 07/16/15 07/11/19  Randol Simmonds, MD    Allergies: Ambien [zolpidem]    Review of Systems  Cardiovascular:  Positive for syncope.  Gastrointestinal:  Positive for abdominal pain.  All other systems reviewed and are negative.   Updated Vital Signs BP 115/78   Pulse (!) 104   Temp 98 F (36.7 C) (Oral)   Resp 19   SpO2 100%   Physical Exam Vitals and nursing note reviewed.  Constitutional:      General: He is not in acute distress.    Appearance: He is well-developed.  HENT:     Head: Normocephalic and atraumatic.     Comments: No lesions present on tongue or lips.  Eyes:     Conjunctiva/sclera: Conjunctivae normal.     Cardiovascular:     Rate and Rhythm: Normal rate and regular rhythm.     Heart sounds: No murmur heard. Pulmonary:     Effort: Pulmonary effort is normal. No respiratory distress.     Breath sounds: Normal breath sounds.  Abdominal:     General: There is no distension.     Palpations: Abdomen is soft.     Tenderness: There is abdominal tenderness. There is no guarding.   Musculoskeletal:        General: No swelling.     Cervical back: Neck supple.   Skin:    General: Skin is warm and dry.     Capillary Refill: Capillary refill takes less than 2 seconds.   Neurological:     Mental Status: He is alert.   Psychiatric:        Mood and Affect: Mood normal.     (all labs ordered are listed, but only abnormal results are displayed) Labs Reviewed  CBC WITH DIFFERENTIAL/PLATELET - Abnormal; Notable for the following components:      Result Value   WBC 13.6 (*)    RBC 3.32 (*)    Hemoglobin 6.5 (*)    HCT 23.4 (*)    MCV 70.5 (*)    MCH 19.6 (*)    MCHC 27.8 (*)    RDW 17.2 (*)    Neutro Abs 11.0 (*)  All other components within normal limits  COMPREHENSIVE METABOLIC PANEL WITH GFR - Abnormal; Notable for the following components:   Glucose, Bld 112 (*)    BUN 32 (*)    Calcium 8.3 (*)    Total Protein 6.4 (*)    Albumin 3.4 (*)    All other components within normal limits  POC OCCULT BLOOD, ED - Abnormal; Notable for the following components:   Fecal Occult Bld POSITIVE (*)    All other components within normal limits  ETHANOL  URINALYSIS, ROUTINE W REFLEX MICROSCOPIC  RAPID URINE DRUG SCREEN, HOSP PERFORMED  HIV ANTIBODY (ROUTINE TESTING W REFLEX)  TYPE AND SCREEN  ABO/RH  PREPARE RBC (CROSSMATCH)    EKG: None  Radiology: CT Head Wo Contrast Result Date: 04/23/2024 CLINICAL DATA:  Seizure, new onset, found unresponsive. EXAM: CT HEAD WITHOUT CONTRAST CT CERVICAL SPINE WITHOUT CONTRAST TECHNIQUE: Multidetector CT imaging of the head and cervical spine  was performed following the standard protocol without intravenous contrast. Multiplanar CT image reconstructions of the cervical spine were also generated. RADIATION DOSE REDUCTION: This exam was performed according to the departmental dose-optimization program which includes automated exposure control, adjustment of the mA and/or kV according to patient size and/or use of iterative reconstruction technique. COMPARISON:  None Available. FINDINGS: CT HEAD FINDINGS Brain: No acute intracranial hemorrhage. No CT evidence of acute infarct. No edema, mass effect, or midline shift. The basilar cisterns are patent. Ventricles: The ventricles are normal. Vascular: No hyperdense vessel or unexpected calcification. Skull: No acute or aggressive finding. Orbits: Orbits are symmetric. Sinuses: Mild mucosal thickening in the ethmoid sinuses. Other: Mastoid air cells are clear. Subgaleal lipoma in the right frontal scalp. CT CERVICAL SPINE FINDINGS Alignment: Normal. Skull base and vertebrae: No acute fracture. No primary bone lesion or focal pathologic process. Soft tissues and spinal canal: No prevertebral fluid or swelling. No visible canal hematoma. Disc levels: Intervertebral disc spaces are maintained. Small disc bulges at multiple levels. There is mild spinal canal narrowing at C2-3 through C4-5. Central disc protrusion at C5-6 resulting in moderate spinal canal narrowing. No high-grade osseous foraminal stenosis. Upper chest: Negative. Other: None. IMPRESSION: No CT evidence of acute intracranial abnormality. No acute fracture or traumatic malalignment of the cervical spine. Degenerative changes of the cervical spine as above. 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 Electronically Signed   By: Donnice Mania M.D.   On: 04/23/2024 13:13   CT Cervical Spine Wo Contrast Result Date: 04/23/2024 CLINICAL DATA:  Seizure, new onset, found unresponsive. EXAM: CT HEAD WITHOUT CONTRAST CT CERVICAL  SPINE WITHOUT CONTRAST TECHNIQUE: Multidetector CT imaging of the head and cervical spine was performed following the standard protocol without intravenous contrast. Multiplanar CT image reconstructions of the cervical spine were also generated. RADIATION DOSE REDUCTION: This exam was performed according to the departmental dose-optimization program which includes automated exposure control, adjustment of the mA and/or kV according to patient size and/or use of iterative reconstruction technique. COMPARISON:  None Available. FINDINGS: CT HEAD FINDINGS Brain: No acute intracranial hemorrhage. No CT evidence of acute infarct. No edema, mass effect, or midline shift. The basilar cisterns are patent. Ventricles: The ventricles are normal. Vascular: No hyperdense vessel or unexpected calcification. Skull: No acute or aggressive finding. Orbits: Orbits are symmetric. Sinuses: Mild mucosal thickening in the ethmoid sinuses. Other: Mastoid air cells are clear. Subgaleal lipoma in the right frontal scalp. CT CERVICAL SPINE FINDINGS Alignment: Normal. Skull base and vertebrae: No acute fracture. No primary  bone lesion or focal pathologic process. Soft tissues and spinal canal: No prevertebral fluid or swelling. No visible canal hematoma. Disc levels: Intervertebral disc spaces are maintained. Small disc bulges at multiple levels. There is mild spinal canal narrowing at C2-3 through C4-5. Central disc protrusion at C5-6 resulting in moderate spinal canal narrowing. No high-grade osseous foraminal stenosis. Upper chest: Negative. Other: None. IMPRESSION: No CT evidence of acute intracranial abnormality. No acute fracture or traumatic malalignment of the cervical spine. Degenerative changes of the cervical spine as above. 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 Electronically Signed   By: Donnice Mania M.D.   On: 04/23/2024 13:13     .Critical Care  Performed by: Everard Interrante A,  PA-C Authorized by: Jousha Schwandt A, PA-C   Critical care provider statement:    Critical care time (minutes):  90   Critical care start time:  04/23/2024 12:30 PM   Critical care end time:  04/23/2024 2:00 PM   Critical care time was exclusive of:  Separately billable procedures and treating other patients   Critical care was necessary to treat or prevent imminent or life-threatening deterioration of the following conditions:  Circulatory failure   Critical care was time spent personally by me on the following activities:  Development of treatment plan with patient or surrogate, discussions with consultants, evaluation of patient's response to treatment, examination of patient, ordering and review of laboratory studies, ordering and review of radiographic studies and re-evaluation of patient's condition   I assumed direction of critical care for this patient from another provider in my specialty: no     Care discussed with: admitting provider      Medications Ordered in the ED  acetaminophen  (TYLENOL ) tablet 650 mg (has no administration in time range)    Or  acetaminophen  (TYLENOL ) suppository 650 mg (has no administration in time range)  traZODone (DESYREL) tablet 25 mg (has no administration in time range)  ondansetron  (ZOFRAN ) tablet 4 mg (has no administration in time range)    Or  ondansetron  (ZOFRAN ) injection 4 mg (has no administration in time range)  albuterol (PROVENTIL) (2.5 MG/3ML) 0.083% nebulizer solution 2.5 mg (has no administration in time range)  pantoprazole  (PROTONIX ) injection 40 mg (has no administration in time range)  0.9 %  sodium chloride  infusion (Manually program via Guardrails IV Fluids) ( Intravenous New Bag/Given 04/23/24 1416)  pantoprazole  (PROTONIX ) injection 80 mg (80 mg Intravenous Given 04/23/24 1418)                                    Medical Decision Making Amount and/or Complexity of Data Reviewed Labs: ordered. Radiology:  ordered.  Risk Prescription drug management. Decision regarding hospitalization.   This patient presents to the ED for concern of seizure, loss of consciousness, this involves an extensive number of treatment options, and is a complaint that carries with it a high risk of complications and morbidity.  The differential diagnosis includes syncope, seizure, symptomatic anemia, GI bleed, PUD   Co morbidities that complicate the patient evaluation  Past history of PUD/gastric ulcer   Additional history obtained:  Additional history obtained from ED visit on 12/01/2023 for duodenitis   Lab Tests:  I Ordered, and personally interpreted labs.  The pertinent results include: CBC shows hemoglobin down to 6.5, leukocytosis at 13.6 possibly secondary to syncopal episode, CMP shows no significant electrolyte derangements, Hemoccult positive   Imaging  Studies ordered:  I ordered imaging studies including CT head, CT cervical spine I independently visualized and interpreted imaging which showed CT imaging negative I agree with the radiologist interpretation   Cardiac Monitoring: / EKG:  The patient was maintained on a cardiac monitor.  I personally viewed and interpreted the cardiac monitored which showed an underlying rhythm of: Pending at time of admission   Consultations Obtained:  I requested consultation with the GI, hospitalist,  and discussed lab and imaging findings as well as pertinent plan - they recommend: GI admission for suspected GI bleed. Spoke with Dr. Dianna, GI. Spoke with Dr. Roxane, hospitalist, who will be admitting patient.   Problem List / ED Course / Critical interventions / Medication management  Patient with past history significant for gastric ulcer, possible seizure disorder presents to the emergency department with concerns of a loss of consciousness.  Patient reportedly was found unconscious at a friend's home earlier today and was slow to respond with EMS  on scene.  Denies any drug use or alcohol use.  Cannot recall exactly events prior to his loss of consciousness today.  States that he has a history of seizures but not currently any medications.  He does endorse some epigastric abdominal pain with some recent dark stool but no obvious hematemesis or hematochezia.  Also reports his stool smells foul similar to prior episodes where he has had GI bleeding. On exam, patient has focal epigastric abdominal tenderness with no guarding present.  Still somewhat slow to respond but behaving appropriately.  Pupils unremarkable.  Will proceed with imaging of head and neck as well as lab workup for evaluation of possible cause of symptoms. CBC shows hemoglobin of 6.5.  Concern for GI bleed.  Transfusion protocol initiated.  Will consult GI for likely evaluation and plan for admission to hospitalist. Spoke with Dr. Dianna, GI, who advised GI will evaluate patient. Spoke with Dr. Roxane, hospitalist, who will be admitting patient. I ordered medication including pantoprazole , fluids, blood transfusion for suspected GI Reevaluation of the patient after these medicines showed that the patient improved I have reviewed the patients home medicines and have made adjustments as needed   Social Determinants of Health:  Likely poor medical management as patient is self reporting seizure history with no antiepileptics and no neurology evaluation or monitoring   Test / Admission - Considered:  Given patient condition and findings of workup, inpatient admission advised and ordered.  Final diagnoses:  Blood loss anemia  Gastrointestinal hemorrhage, unspecified gastrointestinal hemorrhage type    ED Discharge Orders     None          Cecily Legrand LABOR, PA-C 04/23/24 1445    Mannie Pac T, DO 04/25/24 [phone]

## 2024-04-24 ENCOUNTER — Observation Stay (HOSPITAL_COMMUNITY): Payer: Self-pay

## 2024-04-24 ENCOUNTER — Encounter (HOSPITAL_COMMUNITY)
Admission: EM | Disposition: A | Discharge status: Home / Self Care | Payer: Self-pay | Source: Home / Self Care | Attending: Internal Medicine

## 2024-04-24 ENCOUNTER — Observation Stay (HOSPITAL_COMMUNITY): Payer: Self-pay | Admitting: Anesthesiology

## 2024-04-24 ENCOUNTER — Encounter (HOSPITAL_COMMUNITY): Payer: Self-pay | Admitting: Internal Medicine

## 2024-04-24 DIAGNOSIS — K259 Gastric ulcer, unspecified as acute or chronic, without hemorrhage or perforation: Secondary | ICD-10-CM

## 2024-04-24 DIAGNOSIS — K922 Gastrointestinal hemorrhage, unspecified: Secondary | ICD-10-CM | POA: Diagnosis present

## 2024-04-24 HISTORY — PX: ESOPHAGOGASTRODUODENOSCOPY: SHX5428

## 2024-04-24 LAB — CBC
HCT: 22.3 % — ABNORMAL LOW (ref 39.0–52.0)
Hemoglobin: 6.7 g/dL — CL (ref 13.0–17.0)
MCH: 21.4 pg — ABNORMAL LOW (ref 26.0–34.0)
MCHC: 30 g/dL (ref 30.0–36.0)
MCV: 71.2 fL — ABNORMAL LOW (ref 80.0–100.0)
Platelets: 262 10*3/uL (ref 150–400)
RBC: 3.13 MIL/uL — ABNORMAL LOW (ref 4.22–5.81)
RDW: 19.5 % — ABNORMAL HIGH (ref 11.5–15.5)
WBC: 13.1 10*3/uL — ABNORMAL HIGH (ref 4.0–10.5)
nRBC: 0.2 % (ref 0.0–0.2)

## 2024-04-24 LAB — URINALYSIS, ROUTINE W REFLEX MICROSCOPIC
Bacteria, UA: NONE SEEN
Bilirubin Urine: NEGATIVE
Glucose, UA: NEGATIVE mg/dL
Hgb urine dipstick: NEGATIVE
Ketones, ur: NEGATIVE mg/dL
Nitrite: NEGATIVE
Protein, ur: NEGATIVE mg/dL
Specific Gravity, Urine: 1.029 (ref 1.005–1.030)
pH: 5 (ref 5.0–8.0)

## 2024-04-24 LAB — RAPID URINE DRUG SCREEN, HOSP PERFORMED
Amphetamines: POSITIVE — AB
Barbiturates: NOT DETECTED
Benzodiazepines: NOT DETECTED
Cocaine: NOT DETECTED
Opiates: POSITIVE — AB
Tetrahydrocannabinol: NOT DETECTED

## 2024-04-24 LAB — HEMOGLOBIN AND HEMATOCRIT, BLOOD
HCT: 25.1 % — ABNORMAL LOW (ref 39.0–52.0)
Hemoglobin: 7.6 g/dL — ABNORMAL LOW (ref 13.0–17.0)

## 2024-04-24 LAB — PREPARE RBC (CROSSMATCH)

## 2024-04-24 LAB — BASIC METABOLIC PANEL WITH GFR
Anion gap: 7 (ref 5–15)
BUN: 21 mg/dL — ABNORMAL HIGH (ref 6–20)
CO2: 27 mmol/L (ref 22–32)
Calcium: 8.2 mg/dL — ABNORMAL LOW (ref 8.9–10.3)
Chloride: 99 mmol/L (ref 98–111)
Creatinine, Ser: 0.98 mg/dL (ref 0.61–1.24)
GFR, Estimated: >60 mL/min (ref >60)
Glucose, Bld: 99 mg/dL (ref 70–99)
Potassium: 3.5 mmol/L (ref 3.5–5.1)
Sodium: 133 mmol/L — ABNORMAL LOW (ref 135–145)

## 2024-04-24 SURGERY — EGD (ESOPHAGOGASTRODUODENOSCOPY)
Anesthesia: Monitor Anesthesia Care

## 2024-04-24 MED ORDER — DEXMEDETOMIDINE HCL IN NACL 80 MCG/20ML IV SOLN
INTRAVENOUS | Status: DC | PRN
  Administered 2024-04-24: 8 ug via INTRAVENOUS

## 2024-04-24 MED ORDER — PHENYLEPHRINE 80 MCG/ML (10ML) SYRINGE FOR IV PUSH (FOR BLOOD PRESSURE SUPPORT)
PREFILLED_SYRINGE | INTRAVENOUS | Status: DC | PRN
  Administered 2024-04-24 (×3): 160 ug via INTRAVENOUS
  Administered 2024-04-24: 80 ug via INTRAVENOUS
  Administered 2024-04-24 (×2): 160 ug via INTRAVENOUS
  Administered 2024-04-24 (×3): 80 ug via INTRAVENOUS

## 2024-04-24 MED ORDER — PROPOFOL 500 MG/50ML IV EMUL
INTRAVENOUS | Status: DC | PRN
  Administered 2024-04-24: 50 mg via INTRAVENOUS
  Administered 2024-04-24: 100 mg via INTRAVENOUS
  Administered 2024-04-24: 20 mg via INTRAVENOUS
  Administered 2024-04-24: 130 ug/kg/min via INTRAVENOUS
  Administered 2024-04-24: 100 mg via INTRAVENOUS

## 2024-04-24 MED ORDER — ORAL CARE MOUTH RINSE: 15.0000 mL | OROMUCOSAL | Status: DC | PRN

## 2024-04-24 MED ORDER — SODIUM CHLORIDE 0.9% IV SOLUTION: Freq: Once | INTRAVENOUS | Status: AC

## 2024-04-24 MED ORDER — SODIUM CHLORIDE 0.9 % IV SOLN
INTRAVENOUS | Status: AC | PRN
  Administered 2024-04-24: 500 mL via INTRAMUSCULAR

## 2024-04-24 MED ORDER — ONDANSETRON HCL 4 MG/2ML IJ SOLN
INTRAMUSCULAR | Status: DC | PRN
Start: 2024-04-24 — End: 2024-04-24
  Administered 2024-04-24: 4 mg via INTRAVENOUS

## 2024-04-24 MED ORDER — PROPOFOL 500 MG/50ML IV EMUL
INTRAVENOUS | Status: AC
  Filled 2024-04-24: qty 50

## 2024-04-24 MED ORDER — PANTOPRAZOLE SODIUM 40 MG IV SOLR
40.0000 mg | Freq: Three times a day (TID) | INTRAVENOUS | Status: DC
  Administered 2024-04-24 – 2024-04-26 (×5): 40 mg via INTRAVENOUS
  Filled 2024-04-24 (×5): qty 10

## 2024-04-24 MED ORDER — LACTATED RINGERS IV SOLN: INTRAVENOUS | Status: DC

## 2024-04-24 MED ORDER — IOHEXOL 300 MG/ML  SOLN
100.0000 mL | Freq: Once | INTRAMUSCULAR | Status: AC | PRN
  Administered 2024-04-24: 100 mL via INTRAVENOUS

## 2024-04-24 NOTE — Discharge Instructions (Signed)
 [employer] [facility] [email or web address] Tel#[phone]

## 2024-04-24 NOTE — Transfer of Care (Signed)
 Immediate Anesthesia Transfer of Care Note  Patient: Joshua Gutierrez  Procedure(s) Performed: EGD (ESOPHAGOGASTRODUODENOSCOPY)  Patient Location: PACU and Endoscopy Unit  Anesthesia Type:MAC  Level of Consciousness: sedated  Airway & Oxygen Therapy: Patient Spontanous Breathing and Patient connected to nasal cannula oxygen  Post-op Assessment: Report given to RN and Post -op Vital signs reviewed and unstable, Anesthesiologist notified  Post vital signs: Reviewed and stable  Last Vitals:  Vitals Value Taken Time  BP 100/63 04/24/24 16:11  Temp    Pulse 81 04/24/24 16:16  Resp 13 04/24/24 16:16  SpO2 100 % 04/24/24 16:16  Vitals shown include unfiled device data.  Last Pain:  Vitals:   04/24/24 1556  TempSrc:   PainSc: Asleep         Complications: No notable events documented.

## 2024-04-24 NOTE — Op Note (Signed)
 [facility] Patient Name: Joshua Gutierrez Procedure Date: 04/24/2024 MRN: [id number] Attending MD: Jerrell [name] , MD, [id number] Date of Birth: 03-06-1986 CSN: [id number] Age: 38 Admit Type: Inpatient Procedure:                Upper GI endoscopy Indications:              Melena Providers:                Jerrell KYM Sol, MD, Randall Lines, RN, Farris Southgate, Technician Referring MD:             hospital team Medicines:                Propofol per Anesthesia, Monitored Anesthesia Care Complications:            No immediate complications. Estimated Blood Loss:     Estimated blood loss was minimal. Procedure:                Pre-Anesthesia Assessment:                           - Prior to the procedure, a History and Physical                            was performed, and patient medications and                            allergies were reviewed. The patient's tolerance of                            previous anesthesia was also reviewed. The risks                            and benefits of the procedure and the sedation                            options and risks were discussed with the patient.                            All questions were answered, and informed consent                            was obtained. Prior Anticoagulants: The patient has                            taken no anticoagulant or antiplatelet agents. ASA                            Grade Assessment: II - A patient with mild systemic                            disease. After reviewing the risks and benefits,  the patient was deemed in satisfactory condition to                            undergo the procedure.                           After obtaining informed consent, the endoscope was                            passed under direct vision. Throughout the                            procedure, the patient's blood pressure, pulse, and                             oxygen saturations were monitored continuously. The                            GIF-H190 ([id number]) Olympus endoscope was introduced                            through the mouth, and advanced to the second part                            of duodenum. The upper GI endoscopy was performed                            with difficulty due to excessive bleeding.                            Successful completion of the procedure was aided by                            controlling the bleeding. The patient tolerated the                            procedure well. Scope In: Scope Out: Findings:      The examined esophagus was normal.      The Z-line was regular and was found 40 cm from the incisors.      The examined esophagus was normal.      One oozing cratered gastric ulcer with adherent clot was found at the       pylorus. The lesion was 20 mm in largest dimension. For hemostasis,       hemostatic spray was deployed. A single spray was applied. There was no       bleeding at the end of the maneuver.      The examined duodenum was normal.      Segmental severe inflammation characterized by congestion (edema) and       erythema was found in the gastric antrum, in the prepyloric region of       the stomach and at the pylorus. Impression:               - Normal esophagus.                           -  Z-line regular, 40 cm from the incisors.                           - Normal esophagus.                           - Oozing gastric ulcer with adherent clot.                            Hemostatic spray applied.                           - Normal examined duodenum.                           - Gastritis.                           - No specimens collected. Moderate Sedation:      N/A - MAC procedure Recommendation:           - NPO.                           - Observe patient's clinical course.                           - Increased Protonix  to 40 mg IV Q 8 hours.                           - Ice  chips ok today.                           - Perform an H. pylori serology. Procedure Code(s):        --- Professional ---                           [phone], Esophagogastroduodenoscopy, flexible,                            transoral; with control of bleeding, any method Diagnosis Code(s):        --- Professional ---                           K92.1, Melena (includes Hematochezia)                           K25.4, Chronic or unspecified gastric ulcer with                            hemorrhage                           K29.70, Gastritis, unspecified, without bleeding CPT copyright 2022 American Medical Association. All rights reserved. The codes documented in this report are preliminary and upon coder review may  be revised to meet current compliance requirements. Jerrell [name], MD 04/24/2024 3:59:37 PM This report has been signed electronically. Number of Addenda: 0

## 2024-04-24 NOTE — Interval H&P Note (Signed)
 History and Physical Interval Note:  04/24/2024 3:18 PM  Joshua Gutierrez  has presented today for surgery, with the diagnosis of Melena.  The various methods of treatment have been discussed with the patient and family. After consideration of risks, benefits and other options for treatment, the patient has consented to  Procedure(s): EGD (ESOPHAGOGASTRODUODENOSCOPY) (N/A) as a surgical intervention.  The patient's history has been reviewed, patient examined, no change in status, stable for surgery.  I have reviewed the patient's chart and labs.  Questions were answered to the patient's satisfaction.     Jerrell [name]

## 2024-04-24 NOTE — Anesthesia Procedure Notes (Signed)
 Procedure Name: MAC Date/Time: 04/24/2024 3:21 PM  Performed by: Landy Chip HERO, CRNAPre-anesthesia Checklist: Patient identified, Emergency Drugs available, Suction available, Patient being monitored and Timeout performed Patient Re-evaluated:Patient Re-evaluated prior to induction Oxygen Delivery Method: Nasal cannula Preoxygenation: Pre-oxygenation with 100% oxygen Induction Type: IV induction Placement Confirmation: positive ETCO2 Dental Injury: Teeth and Oropharynx as per pre-operative assessment

## 2024-04-24 NOTE — Hospital Course (Signed)
 38 y.o. male with medical history significant for peptic ulcer disease and Bell's palsy being admitted to the hospital with acute blood loss anemia and suspected upper GI bleed.  He does have a known history of gastric ulcer, there is unclear to me if he has ever had upper endoscopy.  States that for the last few days he has been having epigastric abdominal pain worse with eating, and associated nausea without vomiting.  Apparently today he was found down at a friend's house, having defecated on himself dark tarry stool.  Here in the emergency department, was found to have hemoglobin of 6.5 down significantly from last value of 11 in January of this year.  He denies taking any blood thinners, does not take any regular medications.

## 2024-04-24 NOTE — TOC Initial Note (Signed)
 Transition of Care [facility]) - Initial/Assessment Note    Patient Details  Name: Joshua Gutierrez MRN: [id number] Date of Birth: [birth year]  [facility] CM/SW Contact:    Bascom Service, RN Phone Number: 04/24/2024, 2:27 PM  Clinical Narrative:Patient does not have a PCP-provided w/[employer] for health insurance, & [place] [facility]-patient to make own appt. Has own transportation, states can afford his meds. Continue to monitor.                   Expected Discharge Plan: Home/Self Care Barriers to Discharge: Continued Medical Work up   Patient Goals and CMS Choice            Expected Discharge Plan and Services                                              Prior Living Arrangements/Services                       Activities of Daily Living   ADL Screening (condition at time of admission) Independently performs ADLs?: Yes (appropriate for developmental age) Is the patient deaf or have difficulty hearing?: No Does the patient have difficulty seeing, even when wearing glasses/contacts?: No Does the patient have difficulty concentrating, remembering, or making decisions?: Yes  Permission Sought/Granted                  Emotional Assessment              Admission diagnosis:  Blood loss anemia [D50.0] Gastrointestinal hemorrhage, unspecified gastrointestinal hemorrhage type [K92.2] Patient Active Problem List   Diagnosis Date Noted   Blood loss anemia 04/23/2024   PCP:  Patient, No Pcp Per Pharmacy:   [id number] - [place], [place] - [address] AT [facility] [address] [place] [address] Phone: [phone] Fax: [phone]     Social Drivers of Health (SDOH) Social History: SDOH Screenings   Food Insecurity: Food Insecurity Present (04/23/2024)  Housing: Low Risk  (04/23/2024)  Transportation Needs: No Transportation Needs (04/23/2024)  Utilities: Not At Risk  (04/23/2024)  Tobacco Use: High Risk (04/24/2024)   SDOH Interventions:     Readmission Risk Interventions     No data to display

## 2024-04-24 NOTE — Progress Notes (Signed)
 Pt is pleasantly hallucinating (seeing people and voices). He states that he hears voices all that time and they have caused him to be suicidal in the past. Denies SI or self harm at this time and states he is Happy to be alive. MD made aware.

## 2024-04-24 NOTE — Progress Notes (Signed)
  Progress Note   PatientTelesforo Gutierrez FMW:[id number] DOB: Jul 30, 1986 DOA: 04/23/2024     0 DOS: the patient was seen and examined on 04/24/2024   Brief hospital course: 38 y.o. male with medical history significant for peptic ulcer disease and Bell's palsy being admitted to the hospital with acute blood loss anemia and suspected upper GI bleed.  He does have a known history of gastric ulcer, there is unclear to me if he has ever had upper endoscopy.  States that for the last few days he has been having epigastric abdominal pain worse with eating, and associated nausea without vomiting.  Apparently today he was found down at a friend's house, having defecated on himself dark tarry stool.  Here in the emergency department, was found to have hemoglobin of 6.5 down significantly from last value of 11 in January of this year.  He denies taking any blood thinners, does not take any regular medications.   Assessment and Plan: Acute blood loss anemia-with some dizziness/weakness, but denies chest pain.  Hemoglobin is down to 6.5 from 11 about 7 months ago.  Given his history of peptic ulcer disease, as well as guaiac positive stool today, this is likely due to upper GI bleeding from PUD. -pt was transfused 1 unit PRBC - GI consulted and pt is now s/p EGD, finding of deep ulcer in gastric antrum in prepyloric region with oozing and adherent clot -STAT abd CT underway to r/o perforation   PUD with upper GI bleeding-patient is hemodynamically stable, receiving PRBC -IV PPI -Avoid blood thinners and NSAIDs -Clear liquid diet  Subjective: Seen prior to endoscopy. Complains of increased thirst  Physical Exam: Vitals:   04/24/24 1211 04/24/24 1327 04/24/24 1556 04/24/24 1600  BP: 106/71 110/70 (!) 88/54 (!) 78/40  Pulse: 81 85 92 88  Resp: 20 16 (!) 30 20  Temp: 98.3 F (36.8 C) (!) 97.5 F (36.4 C)    TempSrc: Oral Temporal    SpO2: 98% 99% 100% 100%  Weight:  60.9 kg    Height:  5' 6 (1.676 m)      General exam: Awake, laying in bed, in nad Respiratory system: Normal respiratory effort, no wheezing Cardiovascular system: regular rate, s1, s2 Gastrointestinal system: Soft, nondistended, positive BS Central nervous system: CN2-12 grossly intact, strength intact Extremities: Perfused, no clubbing Skin: Normal skin turgor, no notable skin lesions seen Psychiatry: Mood normal // no visual hallucinations   Data Reviewed:   Labs reviewed: Na 133, K 3.5, Cr 0.98, WBC 13.1, Hgb 7.6, Plts 262  Family Communication: Pt in room, family not at bedside  Disposition: Status is: Observation The patient remains OBS appropriate and will d/c before 2 midnights.  Planned Discharge Destination: Home    Author: Garnette Pelt, MD 04/24/2024 4:41 PM  For on call review [email or web address].

## 2024-04-24 NOTE — Anesthesia Preprocedure Evaluation (Addendum)
 Anesthesia Evaluation  Patient identified by MRN, date of birth, ID band Patient awake    Reviewed: Allergy & Precautions, NPO status , Patient's Chart, lab work & pertinent test results  Airway Mallampati: I  TM Distance: >3 FB Neck ROM: Full    Dental  (+) Chipped, Dental Advisory Given,    Pulmonary Current Smoker and Patient abstained from smoking.   Pulmonary exam normal breath sounds clear to auscultation       Cardiovascular negative cardio ROS Normal cardiovascular exam Rhythm:Regular Rate:Normal     Neuro/Psych negative neurological ROS  negative psych ROS   GI/Hepatic PUD,,,(+)     substance abuse  alcohol use  Endo/Other  negative endocrine ROS    Renal/GU negative Renal ROS  negative genitourinary   Musculoskeletal negative musculoskeletal ROS (+)    Abdominal   Peds  Hematology  (+) Blood dyscrasia, anemia   Anesthesia Other Findings 38 y.o. male with medical history significant for peptic ulcer disease and Bell's palsy being admitted to the hospital with acute blood loss anemia and suspected upper GI bleed  Reproductive/Obstetrics                             Anesthesia Physical Anesthesia Plan  ASA: 2  Anesthesia Plan: MAC   Post-op Pain Management:    Induction: Intravenous  PONV Risk Score and Plan: Propofol infusion and Treatment may vary due to age or medical condition  Airway Management Planned: Natural Airway  Additional Equipment:   Intra-op Plan:   Post-operative Plan:   Informed Consent: I have reviewed the patients History and Physical, chart, labs and discussed the procedure including the risks, benefits and alternatives for the proposed anesthesia with the patient or authorized representative who has indicated his/her understanding and acceptance.     Dental advisory given  Plan Discussed with: CRNA  Anesthesia Plan Comments:         Anesthesia Quick Evaluation

## 2024-04-25 LAB — COMPREHENSIVE METABOLIC PANEL WITH GFR
ALT: 10 U/L (ref 0–44)
AST: 13 U/L — ABNORMAL LOW (ref 15–41)
Albumin: 2.7 g/dL — ABNORMAL LOW (ref 3.5–5.0)
Alkaline Phosphatase: 41 U/L (ref 38–126)
Anion gap: 7 (ref 5–15)
BUN: 13 mg/dL (ref 6–20)
CO2: 28 mmol/L (ref 22–32)
Calcium: 8.4 mg/dL — ABNORMAL LOW (ref 8.9–10.3)
Chloride: 103 mmol/L (ref 98–111)
Creatinine, Ser: 0.91 mg/dL (ref 0.61–1.24)
GFR, Estimated: >60 mL/min (ref >60)
Glucose, Bld: 95 mg/dL (ref 70–99)
Potassium: 3.4 mmol/L — ABNORMAL LOW (ref 3.5–5.1)
Sodium: 138 mmol/L (ref 135–145)
Total Bilirubin: 0.6 mg/dL (ref 0.0–1.2)
Total Protein: 5.3 g/dL — ABNORMAL LOW (ref 6.5–8.1)

## 2024-04-25 LAB — CBC
HCT: 21.8 % — ABNORMAL LOW (ref 39.0–52.0)
Hemoglobin: 6.5 g/dL — CL (ref 13.0–17.0)
MCH: 22.3 pg — ABNORMAL LOW (ref 26.0–34.0)
MCHC: 29.8 g/dL — ABNORMAL LOW (ref 30.0–36.0)
MCV: 74.9 fL — ABNORMAL LOW (ref 80.0–100.0)
Platelets: 195 10*3/uL (ref 150–400)
RBC: 2.91 MIL/uL — ABNORMAL LOW (ref 4.22–5.81)
RDW: 20.9 % — ABNORMAL HIGH (ref 11.5–15.5)
WBC: 7.4 10*3/uL (ref 4.0–10.5)
nRBC: 0 % (ref 0.0–0.2)

## 2024-04-25 LAB — HEMOGLOBIN AND HEMATOCRIT, BLOOD
HCT: 25.6 % — ABNORMAL LOW (ref 39.0–52.0)
HCT: 25.5 % — ABNORMAL LOW (ref 39.0–52.0)
Hemoglobin: 7.9 g/dL — ABNORMAL LOW (ref 13.0–17.0)
Hemoglobin: 8.1 g/dL — ABNORMAL LOW (ref 13.0–17.0)

## 2024-04-25 LAB — PREPARE RBC (CROSSMATCH)

## 2024-04-25 LAB — TYPE AND SCREEN

## 2024-04-25 MED ORDER — SODIUM CHLORIDE 0.9% IV SOLUTION: Freq: Once | INTRAVENOUS | Status: AC

## 2024-04-25 NOTE — Progress Notes (Signed)
 [facility] Progress Note  Malachai Frysinger 38 y.o. [birth year]   Subjective: Minimal abdominal pain.  Feels a lot better compared to yesterday.  Wants ice cream.  Mother and sister in room.  Objective: Vital signs: Vitals:   04/25/24 0636 04/25/24 0843  BP:  112/75  Pulse:    Resp: 14 18  Temp:  98.2 F (36.8 C)  SpO2:    P 72  Physical Exam: Gen: alert, no acute distress, thin HEENT: anicteric sclera CV: RRR Chest: CTA B Abd: Soft nontender nondistended, positive bowel sounds Ext: no edema  Lab Results: Recent Labs    04/24/24 0506 04/25/24 0453  NA 133* 138  K 3.5 3.4*  CL 99 103  CO2 27 28  GLUCOSE 99 95  BUN 21* 13  CREATININE 0.98 0.91  CALCIUM 8.2* 8.4*   Recent Labs    04/23/24 1103 04/25/24 0453  AST 17 13*  ALT 15 10  ALKPHOS 51 41  BILITOT 0.3 0.6  PROT 6.4* 5.3*  ALBUMIN 3.4* 2.7*   Recent Labs    04/23/24 1103 04/23/24 1857 04/24/24 0506 04/24/24 1206 04/25/24 0453  WBC 13.6*  --  13.1*  --  7.4  NEUTROABS 11.0*  --   --   --   --   HGB 6.5*   < > 6.7* 7.6* 6.5*  HCT 23.4*   < > 22.3* 25.1* 21.8*  MCV 70.5*  --  71.2*  --  74.9*  PLT 294  --  262  --  195   < > = values in this interval not displayed.      Assessment/Plan: Complicated pyloric channel ulcer with adherent clot that was treated with hemostatic spray.  Due to post EGD abdominal pain and hypotension patient was sent for a CT scan that was negative for any acute abnormalities.  Drop in hemoglobin without any overt bleeding.  Status post 1 unit transfusion today.  Will change to clear liquid diet and if he tolerates that he can have full liquid diet including ice cream this afternoon.  He will need a repeat upper endoscopy in 3 months to evaluate for healing and if it is not healed he will need biopsies at that time.  Due to gastric ulcer bleed biopsies were not taken during endoscopy yesterday.  Jerrell [name] 04/25/2024, 9:53 AM  Questions please call  [phone] ID: Eva Serve, male   DOB: [birth year], 38 y.o.   MRN: [id number]

## 2024-04-25 NOTE — Anesthesia Postprocedure Evaluation (Signed)
 Anesthesia Post Note  Patient: Joshua Gutierrez  Procedure(s) Performed: EGD (ESOPHAGOGASTRODUODENOSCOPY)     Patient location during evaluation: Endoscopy Anesthesia Type: MAC Level of consciousness: awake and alert Pain management: pain level controlled Vital Signs Assessment: post-procedure vital signs reviewed and stable Respiratory status: spontaneous breathing, nonlabored ventilation, respiratory function stable and patient connected to nasal cannula oxygen Cardiovascular status: blood pressure returned to baseline and stable Postop Assessment: no apparent nausea or vomiting Anesthetic complications: no  No notable events documented.  Last Vitals:  Vitals:   04/25/24 0630 04/25/24 0636  BP: 100/63   Pulse: 70   Resp: 14 14  Temp: 37.1 C   SpO2: 99%     Last Pain:  Vitals:   04/25/24 0630  TempSrc: Oral  PainSc:    Pain Goal: Patients Stated Pain Goal: 2 (04/24/24 1744)                 Arnice Vanepps L [name]

## 2024-04-25 NOTE — Progress Notes (Signed)
  Progress Note   PatientHumberto Gutierrez FMW:[id number] DOB: 09-Jan-1986 DOA: 04/23/2024     1 DOS: the patient was seen and examined on 04/25/2024   Brief hospital course: 38 y.o. male with medical history significant for peptic ulcer disease and Bell's palsy being admitted to the hospital with acute blood loss anemia and suspected upper GI bleed.  He does have a known history of gastric ulcer, there is unclear to me if he has ever had upper endoscopy.  States that for the last few days he has been having epigastric abdominal pain worse with eating, and associated nausea without vomiting.  Apparently today he was found down at a friend's house, having defecated on himself dark tarry stool.  Here in the emergency department, was found to have hemoglobin of 6.5 down significantly from last value of 11 in January of this year.  He denies taking any blood thinners, does not take any regular medications.   Assessment and Plan: Acute blood loss anemia-with some dizziness/weakness, but denies chest pain.  Hemoglobin is down to 6.5 from 11 about 7 months ago.  Given his history of peptic ulcer disease, as well as guaiac positive stool today, this is likely due to upper GI bleeding from PUD. -pt was transfused 1 unit PRBC - GI consulted and pt is now s/p EGD, finding of deep ulcer in gastric antrum in prepyloric region with oozing and adherent clot -f/u CT abd unremarkable -Hgb down to 6.5 this AM. Received another unit of PRBC. Post-transfusion hgb stable   PUD with upper GI bleeding-patient is hemodynamically stable, receiving PRBC -IV PPI -Avoid blood thinners and NSAIDs. Pt understands no ETOH in the future -Clear liquid diet  Subjective: Asking for more to eat, feeling hungry  Physical Exam: Vitals:   04/25/24 0630 04/25/24 0636 04/25/24 0843 04/25/24 1230  BP: 100/63  112/75 104/66  Pulse: 70   79  Resp: 14 14 18  (!) 21  Temp: 98.8 F (37.1 C)  98.2 F (36.8 C) 98.3 F (36.8 C)  TempSrc: Oral   Oral Oral  SpO2: 99%   100%  Weight:      Height:       General exam: Conversant, in no acute distress Respiratory system: normal chest rise, clear, no audible wheezing Cardiovascular system: regular rhythm, s1-s2 Gastrointestinal system: Nondistended, nontender, pos BS Central nervous system: No seizures, no tremors Extremities: No cyanosis, no joint deformities Skin: No rashes, no pallor Psychiatry: Affect normal // no auditory hallucinations   Data Reviewed:   Labs reviewed: Na 138, K 3.4, Cr 0.91, Hgb 6.5->8.1  Family Communication: Pt in room, family not at bedside  Disposition: Status is: Observation The patient remains OBS appropriate and will d/c before 2 midnights.  Planned Discharge Destination: Home    Author: Garnette Pelt, MD 04/25/2024 4:45 PM  For on call review [email or web address].

## 2024-04-26 ENCOUNTER — Encounter (HOSPITAL_COMMUNITY): Payer: Self-pay | Admitting: Gastroenterology

## 2024-04-26 LAB — CBC
HCT: 25.5 % — ABNORMAL LOW (ref 39.0–52.0)
Hemoglobin: 7.9 g/dL — ABNORMAL LOW (ref 13.0–17.0)
MCH: 24.2 pg — ABNORMAL LOW (ref 26.0–34.0)
MCHC: 31 g/dL (ref 30.0–36.0)
MCV: 78 fL — ABNORMAL LOW (ref 80.0–100.0)
Platelets: 200 10*3/uL (ref 150–400)
RBC: 3.27 MIL/uL — ABNORMAL LOW (ref 4.22–5.81)
RDW: 21 % — ABNORMAL HIGH (ref 11.5–15.5)
WBC: 6.3 10*3/uL (ref 4.0–10.5)
nRBC: 0 % (ref 0.0–0.2)

## 2024-04-26 LAB — TYPE AND SCREEN
ABO/RH(D): O POS
Antibody Screen: NEGATIVE
Unit division: 0
Unit division: 0
Unit division: 0

## 2024-04-26 LAB — BPAM RBC
Blood Product Expiration Date: 202507172359
Blood Product Expiration Date: 202507222359
Blood Product Expiration Date: 202507252359
ISSUE DATE / TIME: 202506241401
ISSUE DATE / TIME: 202506250739
ISSUE DATE / TIME: 202506260608
Unit Type and Rh: 5100
Unit Type and Rh: 5100
Unit Type and Rh: 5100

## 2024-04-26 MED ORDER — PANTOPRAZOLE SODIUM 40 MG IV SOLR
40.0000 mg | Freq: Two times a day (BID) | INTRAVENOUS | Status: DC
  Administered 2024-04-26 – 2024-04-27 (×2): 40 mg via INTRAVENOUS
  Filled 2024-04-26 (×2): qty 10

## 2024-04-26 NOTE — Progress Notes (Addendum)
 [facility] Progress Note  Joshua Gutierrez 38 y.o. [birth year]   Subjective: Abdominal pain, nausea or vomiting.  Eating solid food.  Feels good.  No bowel movements or rectal bleeding overnight.  Objective: Vital signs: Vitals:   04/26/24 0532 04/26/24 1138  BP: 103/71 115/72  Pulse: 69 97  Resp: 20 20  Temp: 97.7 F (36.5 C) 98.2 F (36.8 C)  SpO2: 99% 100%    Physical Exam: Gen: alert, no acute distress, thin, pleasant HEENT: anicteric sclera CV: RRR Chest: CTA B Abd: Soft nontender nondistended, positive bowel sounds Ext: no edema  Lab Results: Recent Labs    04/24/24 0506 04/25/24 0453  NA 133* 138  K 3.5 3.4*  CL 99 103  CO2 27 28  GLUCOSE 99 95  BUN 21* 13  CREATININE 0.98 0.91  CALCIUM 8.2* 8.4*   Recent Labs    04/25/24 0453  AST 13*  ALT 10  ALKPHOS 41  BILITOT 0.6  PROT 5.3*  ALBUMIN 2.7*   Recent Labs    04/25/24 0453 04/25/24 1112 04/25/24 1518 04/26/24 0459  WBC 7.4  --   --  6.3  HGB 6.5*   < > 8.1* 7.9*  HCT 21.8*   < > 25.5* 25.5*  MCV 74.9*  --   --  78.0*  PLT 195  --   --  200   < > = values in this interval not displayed.      Assessment/Plan: Complicated distal gastric ulcer bleed without any signs of recurrent bleeding. Hgb 7.9.  Will continue IV PPI today but will change to IV Q 12 hours.and changed to 40 mg p.o. twice daily tomorrow to continue as an outpatient for 3 months.  Will need to repeat upper endoscopy in 3 to 4 months to verify healing.  Follow-up with me in 6 to 8 weeks in office will arrange. D/C tomorrow if ok. Discussed with Dr. Cindy.  [facility] will sign off.  Call if questions. Dr. Kriss on call this weekend if needed.  Joshua Gutierrez 04/26/2024, 12:47 PM  Questions please call [phone] ID: Joshua Gutierrez, male   DOB: 1986-01-11, 38 y.o.   MRN: [id number]

## 2024-04-26 NOTE — Progress Notes (Signed)
  Progress Note   PatientSadie Gutierrez FMW:[id number] DOB: 1986/10/15 DOA: 04/23/2024     2 DOS: the patient was seen and examined on 04/26/2024   Brief hospital course: 38 y.o. male with medical history significant for peptic ulcer disease and Bell's palsy being admitted to the hospital with acute blood loss anemia and suspected upper GI bleed.  He does have a known history of gastric ulcer, there is unclear to me if he has ever had upper endoscopy.  States that for the last few days he has been having epigastric abdominal pain worse with eating, and associated nausea without vomiting.  Apparently today he was found down at a friend's house, having defecated on himself dark tarry stool.  Here in the emergency department, was found to have hemoglobin of 6.5 down significantly from last value of 11 in January of this year.  He denies taking any blood thinners, does not take any regular medications.   Assessment and Plan: Acute blood loss anemia-with some dizziness/weakness, but denies chest pain.  Hemoglobin is down to 6.5 from 11 about 7 months ago.  Given his history of peptic ulcer disease, as well as guaiac positive stool today, this is likely due to upper GI bleeding from PUD. - GI consulted and pt is now s/p EGD, finding of deep ulcer in gastric antrum in prepyloric region with oozing and adherent clot -f/u CT abd unremarkable -Pt is s/p total 2 units PRBC's this visit. Hgb is remaining stable   PUD with upper GI bleeding-patient is hemodynamically stable, receiving PRBC -IV PPI -Avoid blood thinners and NSAIDs. Pt understands no ETOH in the future -advanced to soft diet  Subjective: Tolerating diet well. Eager to go home soon  Physical Exam: Vitals:   04/25/24 1230 04/25/24 1935 04/26/24 0532 04/26/24 1138  BP: 104/66 107/71 103/71 115/72  Pulse: 79 73 69 97  Resp: (!) 21 20 20 20   Temp: 98.3 F (36.8 C) 98.1 F (36.7 C) 97.7 F (36.5 C) 98.2 F (36.8 C)  TempSrc: Oral Oral Oral Oral   SpO2: 100% 100% 99% 100%  Weight:      Height:       General exam: Awake, laying in bed, in nad Respiratory system: Normal respiratory effort, no wheezing Cardiovascular system: regular rate, s1, s2 Gastrointestinal system: Soft, nondistended, positive BS Central nervous system: CN2-12 grossly intact, strength intact Extremities: Perfused, no clubbing Skin: Normal skin turgor, no notable skin lesions seen Psychiatry: Mood normal // no visual hallucinations   Data Reviewed:   Labs reviewed: WBC 6.3, Hgb 7.9, Plts 200  Family Communication: Pt in room, family not at bedside  Disposition: Status is: Inpatient The patient will require care spanning > 2 midnights and should be moved to inpatient because: severity of illness  Planned Discharge Destination: Home    Author: Garnette Pelt, MD 04/26/2024 6:32 PM  For on call review [email or web address].

## 2024-04-26 NOTE — Plan of Care (Signed)

## 2024-04-27 ENCOUNTER — Other Ambulatory Visit (HOSPITAL_COMMUNITY): Payer: Self-pay

## 2024-04-27 LAB — CBC
HCT: 28 % — ABNORMAL LOW (ref 39.0–52.0)
Hemoglobin: 8.6 g/dL — ABNORMAL LOW (ref 13.0–17.0)
MCH: 23.6 pg — ABNORMAL LOW (ref 26.0–34.0)
MCHC: 30.7 g/dL (ref 30.0–36.0)
MCV: 76.7 fL — ABNORMAL LOW (ref 80.0–100.0)
Platelets: 265 10*3/uL (ref 150–400)
RBC: 3.65 MIL/uL — ABNORMAL LOW (ref 4.22–5.81)
RDW: 21.2 % — ABNORMAL HIGH (ref 11.5–15.5)
WBC: 6.9 10*3/uL (ref 4.0–10.5)
nRBC: 0 % (ref 0.0–0.2)

## 2024-04-27 MED ORDER — PANTOPRAZOLE SODIUM 40 MG PO TBEC
40.0000 mg | DELAYED_RELEASE_TABLET | Freq: Two times a day (BID) | ORAL | 0 refills | Status: DC
  Filled 2024-04-27: qty 240, 120d supply, fill #0

## 2024-04-27 NOTE — Plan of Care (Signed)

## 2024-04-27 NOTE — Discharge Summary (Signed)
 Physician Discharge Summary   Patient: Joshua Gutierrez MRN: [id number] DOB: Jun 26, 1986  Admit date:     04/23/2024  Discharge date: 04/27/24  Discharge Physician: Garnette Pelt   PCP: Patient, No Pcp Per   Recommendations at discharge:    Follow up with PCP as scheduled Follow up with GI as will be scheduled  Discharge Diagnoses: Principal Problem:   Blood loss anemia Active Problems:   Upper GI bleed  Resolved Problems:   * No resolved hospital problems. *  Hospital Course: 38 y.o. male with medical history significant for peptic ulcer disease and Bell's palsy being admitted to the hospital with acute blood loss anemia and suspected upper GI bleed.  He does have a known history of gastric ulcer, there is unclear to me if he has ever had upper endoscopy.  States that for the last few days he has been having epigastric abdominal pain worse with eating, and associated nausea without vomiting.  Apparently today he was found down at a friend's house, having defecated on himself dark tarry stool.  Here in the emergency department, was found to have hemoglobin of 6.5 down significantly from last value of 11 in January of this year.  He denies taking any blood thinners, does not take any regular medications.   Assessment and Plan: Acute blood loss anemia-with some dizziness/weakness, but denies chest pain.  Hemoglobin is down to 6.5 from 11 about 7 months ago.  Given his history of peptic ulcer disease, as well as guaiac positive stool today, this is likely due to upper GI bleeding from PUD. - GI consulted and pt is now s/p EGD, finding of deep ulcer in gastric antrum in prepyloric region with oozing and adherent clot -f/u CT abd unremarkable -Pt is s/p total 2 units PRBC's this visit. Hgb has remained stable -3 months of BID PPI recommended by GI on d/c   PUD with upper GI bleeding-patient is hemodynamically stable, receiving PRBC -was given IV PPI -Avoid blood thinners and NSAIDs. Pt  understands no ETOH in the future -Advanced to soft diet   Consultants: GI Procedures performed: EGD  Disposition: Home Diet recommendation:  Regular diet DISCHARGE MEDICATION: Allergies as of 04/27/2024       Reactions   Ambien [zolpidem] Other (See Comments)   Seizures and Dizziness (Patient did not acknowledge this in 2025, however)        Medication List     STOP taking these medications    ondansetron  4 MG disintegrating tablet Commonly known as: ZOFRAN -ODT       TAKE these medications    pantoprazole  40 MG tablet Commonly known as: Protonix  Take 1 tablet (40 mg total) by mouth 2 (two) times daily. What changed:  medication strength when to take this        Follow-up Information     [facility]. Schedule an appointment as soon as possible for a visit.   Specialty: Family Medicine Contact information: [address] [place] [facility] [place]  [id number] [phone] Additional information: [address]  [place], [place] [address]               Discharge Exam: Joshua Gutierrez   04/23/24 1618 04/24/24 1327  Weight: 60.9 kg 60.9 kg   General exam: Awake, laying in bed, in nad Respiratory system: Normal respiratory effort, no wheezing Cardiovascular system: regular rate, s1, s2 Gastrointestinal system: Soft, nondistended, positive BS Central nervous system: CN2-12 grossly intact, strength intact Extremities: Perfused, no clubbing Skin: Normal skin  turgor, no notable skin lesions seen Psychiatry: Mood normal // no visual hallucinations   Condition at discharge: fair  The results of significant diagnostics from this hospitalization (including imaging, microbiology, ancillary and laboratory) are listed below for reference.   Imaging Studies: CT ABDOMEN PELVIS W CONTRAST Result Date: 04/24/2024 CLINICAL DATA:  Acute generalized abdominal pain after endoscopy. EXAM: CT ABDOMEN AND PELVIS WITH CONTRAST  TECHNIQUE: Multidetector CT imaging of the abdomen and pelvis was performed using the standard protocol following bolus administration of intravenous contrast. RADIATION DOSE REDUCTION: This exam was performed according to the departmental dose-optimization program which includes automated exposure control, adjustment of the mA and/or kV according to patient size and/or use of iterative reconstruction technique. CONTRAST:  OMNIPAQUE  IOHEXOL  300 MG/ML  SOLN COMPARISON:  December 01, 2023. FINDINGS: Lower chest: No acute abnormality. Hepatobiliary: No focal liver abnormality is seen. No gallstones, gallbladder wall thickening, or biliary dilatation. Pancreas: Unremarkable. No pancreatic ductal dilatation or surrounding inflammatory changes. Spleen: Normal in size without focal abnormality. Adrenals/Urinary Tract: Adrenal glands are unremarkable. Kidneys are normal, without renal calculi, focal lesion, or hydronephrosis. Bladder is unremarkable. Stomach/Bowel: Stomach is unremarkable. No evidence of bowel obstruction or inflammation. Appendix is not clearly visualized. Vascular/Lymphatic: No significant vascular findings are present. No enlarged abdominal or pelvic lymph nodes. Reproductive: Prostate is unremarkable. Other: No abdominal wall hernia or abnormality. No abdominopelvic ascites. Musculoskeletal: No acute or significant osseous findings. IMPRESSION: No definite abnormality seen in abdomen or pelvis. Electronically Signed   By: Lynwood Landy Raddle M.D.   On: 04/24/2024 16:57   CT Head Wo Contrast Result Date: 04/23/2024 CLINICAL DATA:  Seizure, new onset, found unresponsive. EXAM: CT HEAD WITHOUT CONTRAST CT CERVICAL SPINE WITHOUT CONTRAST TECHNIQUE: Multidetector CT imaging of the head and cervical spine was performed following the standard protocol without intravenous contrast. Multiplanar CT image reconstructions of the cervical spine were also generated. RADIATION DOSE REDUCTION: This exam was  performed according to the departmental dose-optimization program which includes automated exposure control, adjustment of the mA and/or kV according to patient size and/or use of iterative reconstruction technique. COMPARISON:  None Available. FINDINGS: CT HEAD FINDINGS Brain: No acute intracranial hemorrhage. No CT evidence of acute infarct. No edema, mass effect, or midline shift. The basilar cisterns are patent. Ventricles: The ventricles are normal. Vascular: No hyperdense vessel or unexpected calcification. Skull: No acute or aggressive finding. Orbits: Orbits are symmetric. Sinuses: Mild mucosal thickening in the ethmoid sinuses. Other: Mastoid air cells are clear. Subgaleal lipoma in the right frontal scalp. CT CERVICAL SPINE FINDINGS Alignment: Normal. Skull base and vertebrae: No acute fracture. No primary bone lesion or focal pathologic process. Soft tissues and spinal canal: No prevertebral fluid or swelling. No visible canal hematoma. Disc levels: Intervertebral disc spaces are maintained. Small disc bulges at multiple levels. There is mild spinal canal narrowing at C2-3 through C4-5. Central disc protrusion at C5-6 resulting in moderate spinal canal narrowing. No high-grade osseous foraminal stenosis. Upper chest: Negative. Other: None. IMPRESSION: No CT evidence of acute intracranial abnormality. No acute fracture or traumatic malalignment of the cervical spine. Degenerative changes of the cervical spine as above. 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 Electronically Signed   By: Donnice Mania M.D.   On: 04/23/2024 13:13   CT Cervical Spine Wo Contrast Result Date: 04/23/2024 CLINICAL DATA:  Seizure, new onset, found unresponsive. EXAM: CT HEAD WITHOUT CONTRAST CT CERVICAL SPINE WITHOUT CONTRAST TECHNIQUE: Multidetector CT imaging of the head and cervical  spine was performed following the standard protocol without intravenous contrast. Multiplanar CT image  reconstructions of the cervical spine were also generated. RADIATION DOSE REDUCTION: This exam was performed according to the departmental dose-optimization program which includes automated exposure control, adjustment of the mA and/or kV according to patient size and/or use of iterative reconstruction technique. COMPARISON:  None Available. FINDINGS: CT HEAD FINDINGS Brain: No acute intracranial hemorrhage. No CT evidence of acute infarct. No edema, mass effect, or midline shift. The basilar cisterns are patent. Ventricles: The ventricles are normal. Vascular: No hyperdense vessel or unexpected calcification. Skull: No acute or aggressive finding. Orbits: Orbits are symmetric. Sinuses: Mild mucosal thickening in the ethmoid sinuses. Other: Mastoid air cells are clear. Subgaleal lipoma in the right frontal scalp. CT CERVICAL SPINE FINDINGS Alignment: Normal. Skull base and vertebrae: No acute fracture. No primary bone lesion or focal pathologic process. Soft tissues and spinal canal: No prevertebral fluid or swelling. No visible canal hematoma. Disc levels: Intervertebral disc spaces are maintained. Small disc bulges at multiple levels. There is mild spinal canal narrowing at C2-3 through C4-5. Central disc protrusion at C5-6 resulting in moderate spinal canal narrowing. No high-grade osseous foraminal stenosis. Upper chest: Negative. Other: None. IMPRESSION: No CT evidence of acute intracranial abnormality. No acute fracture or traumatic malalignment of the cervical spine. Degenerative changes of the cervical spine as above. 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 Electronically Signed   By: Donnice Mania M.D.   On: 04/23/2024 13:13    Microbiology: No results found for this or any previous visit.  Labs: CBC: Recent Labs  Lab 04/23/24 1103 04/23/24 1857 04/24/24 0506 04/24/24 1206 04/25/24 0453 04/25/24 1112 04/25/24 1518 04/26/24 0459 04/27/24 0716  WBC 13.6*  --   13.1*  --  7.4  --   --  6.3 6.9  NEUTROABS 11.0*  --   --   --   --   --   --   --   --   HGB 6.5*   < > 6.7*   < > 6.5* 7.9* 8.1* 7.9* 8.6*  HCT 23.4*   < > 22.3*   < > 21.8* 25.6* 25.5* 25.5* 28.0*  MCV 70.5*  --  71.2*  --  74.9*  --   --  78.0* 76.7*  PLT 294  --  262  --  195  --   --  200 265   < > = values in this interval not displayed.   Basic Metabolic Panel: Recent Labs  Lab 04/23/24 1103 04/24/24 0506 04/25/24 0453  NA 137 133* 138  K 4.3 3.5 3.4*  CL 105 99 103  CO2 25 27 28   GLUCOSE 112* 99 95  BUN 32* 21* 13  CREATININE 0.79 0.98 0.91  CALCIUM 8.3* 8.2* 8.4*   Liver Function Tests: Recent Labs  Lab 04/23/24 1103 04/25/24 0453  AST 17 13*  ALT 15 10  ALKPHOS 51 41  BILITOT 0.3 0.6  PROT 6.4* 5.3*  ALBUMIN 3.4* 2.7*   CBG: No results for input(s): GLUCAP in the last 168 hours.  Discharge time spent: less than 30 minutes.  Signed: Garnette Pelt, MD [facility] 04/27/2024

## 2024-04-27 NOTE — Plan of Care (Signed)
  Problem: Education: Goal: Knowledge of General Education information will improve Description: Including pain rating scale, medication(s)/side effects and non-pharmacologic comfort measures 04/27/2024 1148 by Rosanne Elspeth HERO, RN Outcome: Adequate for Discharge 04/27/2024 [phone] by Rosanne Elspeth HERO, RN Outcome: Progressing   Problem: Health Behavior/Discharge Planning: Goal: Ability to manage health-related needs will improve 04/27/2024 1148 by Rosanne Elspeth HERO, RN Outcome: Adequate for Discharge 04/27/2024 [phone] by Rosanne Elspeth HERO, RN Outcome: Progressing   Problem: Clinical Measurements: Goal: Ability to maintain clinical measurements within normal limits will improve 04/27/2024 1148 by Rosanne Elspeth HERO, RN Outcome: Adequate for Discharge 04/27/2024 [phone] by Rosanne Elspeth HERO, RN Outcome: Progressing Goal: Will remain free from infection 04/27/2024 1148 by Rosanne Elspeth HERO, RN Outcome: Adequate for Discharge 04/27/2024 [phone] by Rosanne Elspeth HERO, RN Outcome: Progressing Goal: Diagnostic test results will improve 04/27/2024 1148 by Rosanne Elspeth HERO, RN Outcome: Adequate for Discharge 04/27/[phone] by Rosanne Elspeth HERO, RN Outcome: Progressing Goal: Respiratory complications will improve 04/27/2024 1148 by Rosanne Elspeth HERO, RN Outcome: Adequate for Discharge 04/27/2024 [phone] by Rosanne Elspeth HERO, RN Outcome: Progressing Goal: Cardiovascular complication will be avoided 04/27/2024 1148 by Rosanne Elspeth HERO, RN Outcome: Adequate for Discharge 04/27/2024 [phone] by Rosanne Elspeth HERO, RN Outcome: Progressing   Problem: Activity: Goal: Risk for activity intolerance will decrease 04/27/2024 1148 by Rosanne Elspeth HERO, RN Outcome: Adequate for Discharge 04/27/2024 [phone] by Rosanne Elspeth HERO, RN Outcome: Progressing   Problem: Nutrition: Goal: Adequate nutrition will be maintained 04/27/2024 1148 by Rosanne Elspeth HERO, RN Outcome:  Adequate for Discharge 04/27/2024 [phone] by Rosanne Elspeth HERO, RN Outcome: Progressing   Problem: Coping: Goal: Level of anxiety will decrease 04/27/2024 1148 by Rosanne Elspeth HERO, RN Outcome: Adequate for Discharge 04/27/2024 [phone] by Rosanne Elspeth HERO, RN Outcome: Progressing   Problem: Elimination: Goal: Will not experience complications related to bowel motility 04/27/2024 1148 by Rosanne Elspeth HERO, RN Outcome: Adequate for Discharge 04/27/2024 [phone] by Rosanne Elspeth HERO, RN Outcome: Progressing Goal: Will not experience complications related to urinary retention 04/27/2024 1148 by Rosanne Elspeth HERO, RN Outcome: Adequate for Discharge 04/27/2024 [phone] by Rosanne Elspeth HERO, RN Outcome: Progressing   Problem: Pain Managment: Goal: General experience of comfort will improve and/or be controlled 04/27/2024 1148 by Rosanne Elspeth HERO, RN Outcome: Adequate for Discharge 04/27/[phone] by Rosanne Elspeth HERO, RN Outcome: Progressing   Problem: Safety: Goal: Ability to remain free from injury will improve 04/27/2024 1148 by Rosanne Elspeth HERO, RN Outcome: Adequate for Discharge 04/27/[phone] by Rosanne Elspeth HERO, RN Outcome: Progressing   Problem: Skin Integrity: Goal: Risk for impaired skin integrity will decrease 04/27/2024 1148 by Rosanne Elspeth HERO, RN Outcome: Adequate for Discharge 04/27/2024 [phone] by Rosanne Elspeth HERO, RN Outcome: Progressing

## 2024-04-27 NOTE — TOC Transition Note (Signed)
 Transition of Care [facility]) - Discharge Note   Patient Details  Name: Joshua Gutierrez MRN: [id number] Date of Birth: [birth year]  [facility] CM/SW Contact:  Sonda Manuella Quill, RN Phone Number: 04/27/2024, 12:00 PM   Clinical Narrative:    D/C orders received; no TOC needs.   Final next level of care: Home/Self Care Barriers to Discharge: No Barriers Identified   Patient Goals and CMS Choice            Discharge Placement                       Discharge Plan and Services Additional resources added to the After Visit Summary for                                       Social Drivers of Health (SDOH) Interventions SDOH Screenings   Food Insecurity: Food Insecurity Present (04/23/2024)  Housing: Low Risk  (04/23/2024)  Transportation Needs: No Transportation Needs (04/23/2024)  Utilities: Not At Risk (04/23/2024)  Tobacco Use: High Risk (04/24/2024)     Readmission Risk Interventions     No data to display

## 2024-05-22 ENCOUNTER — Encounter (HOSPITAL_COMMUNITY): Payer: Self-pay

## 2024-05-22 ENCOUNTER — Emergency Department (HOSPITAL_COMMUNITY): Payer: Self-pay

## 2024-05-22 ENCOUNTER — Inpatient Hospital Stay (HOSPITAL_COMMUNITY)
Admission: EM | Admit: 2024-05-22 | Discharge: 2024-05-25 | DRG: 378 | Disposition: A | Discharge status: Home / Self Care | Payer: Self-pay | Attending: Internal Medicine | Admitting: Internal Medicine

## 2024-05-22 ENCOUNTER — Other Ambulatory Visit: Payer: Self-pay

## 2024-05-22 DIAGNOSIS — K253 Acute gastric ulcer without hemorrhage or perforation: Secondary | ICD-10-CM

## 2024-05-22 DIAGNOSIS — E876 Hypokalemia: Secondary | ICD-10-CM | POA: Diagnosis present

## 2024-05-22 DIAGNOSIS — D509 Iron deficiency anemia, unspecified: Secondary | ICD-10-CM | POA: Diagnosis present

## 2024-05-22 DIAGNOSIS — Z888 Allergy status to other drugs, medicaments and biological substances status: Secondary | ICD-10-CM

## 2024-05-22 DIAGNOSIS — F17210 Nicotine dependence, cigarettes, uncomplicated: Secondary | ICD-10-CM | POA: Diagnosis present

## 2024-05-22 DIAGNOSIS — K2971 Gastritis, unspecified, with bleeding: Secondary | ICD-10-CM | POA: Diagnosis present

## 2024-05-22 DIAGNOSIS — R1013 Epigastric pain: Secondary | ICD-10-CM

## 2024-05-22 DIAGNOSIS — Z91199 Patient's noncompliance with other medical treatment and regimen due to unspecified reason: Secondary | ICD-10-CM

## 2024-05-22 DIAGNOSIS — Z5982 Transportation insecurity: Secondary | ICD-10-CM

## 2024-05-22 DIAGNOSIS — K922 Gastrointestinal hemorrhage, unspecified: Principal | ICD-10-CM | POA: Diagnosis present

## 2024-05-22 DIAGNOSIS — D62 Acute posthemorrhagic anemia: Secondary | ICD-10-CM | POA: Diagnosis present

## 2024-05-22 DIAGNOSIS — Z5941 Food insecurity: Secondary | ICD-10-CM

## 2024-05-22 DIAGNOSIS — K254 Chronic or unspecified gastric ulcer with hemorrhage: Principal | ICD-10-CM | POA: Diagnosis present

## 2024-05-22 LAB — COMPREHENSIVE METABOLIC PANEL WITH GFR
ALT: 13 U/L (ref 0–44)
AST: 16 U/L (ref 15–41)
Albumin: 3.7 g/dL (ref 3.5–5.0)
Alkaline Phosphatase: 59 U/L (ref 38–126)
Anion gap: 8 (ref 5–15)
BUN: 7 mg/dL (ref 6–20)
CO2: 27 mmol/L (ref 22–32)
Calcium: 8.7 mg/dL — ABNORMAL LOW (ref 8.9–10.3)
Chloride: 102 mmol/L (ref 98–111)
Creatinine, Ser: 0.7 mg/dL (ref 0.61–1.24)
GFR, Estimated: >60 mL/min (ref >60)
Glucose, Bld: 108 mg/dL — ABNORMAL HIGH (ref 70–99)
Potassium: 3.1 mmol/L — ABNORMAL LOW (ref 3.5–5.1)
Sodium: 137 mmol/L (ref 135–145)
Total Bilirubin: 0.5 mg/dL (ref 0.0–1.2)
Total Protein: 7.5 g/dL (ref 6.5–8.1)

## 2024-05-22 LAB — URINALYSIS, ROUTINE W REFLEX MICROSCOPIC
Bilirubin Urine: NEGATIVE
Glucose, UA: NEGATIVE mg/dL
Hgb urine dipstick: NEGATIVE
Ketones, ur: NEGATIVE mg/dL
Leukocytes,Ua: NEGATIVE
Nitrite: NEGATIVE
Protein, ur: NEGATIVE mg/dL
Specific Gravity, Urine: 1.011 (ref 1.005–1.030)
pH: 8 (ref 5.0–8.0)

## 2024-05-22 LAB — CBC WITH DIFFERENTIAL/PLATELET
Abs Immature Granulocytes: 0.02 K/uL (ref 0.00–0.07)
Basophils Absolute: 0.1 K/uL (ref 0.0–0.1)
Basophils Relative: 1 %
Eosinophils Absolute: 1.9 K/uL — ABNORMAL HIGH (ref 0.0–0.5)
Eosinophils Relative: 26 %
HCT: 32 % — ABNORMAL LOW (ref 39.0–52.0)
Hemoglobin: 9.3 g/dL — ABNORMAL LOW (ref 13.0–17.0)
Immature Granulocytes: 0 %
Lymphocytes Relative: 23 %
Lymphs Abs: 1.7 K/uL (ref 0.7–4.0)
MCH: 21.2 pg — ABNORMAL LOW (ref 26.0–34.0)
MCHC: 29.1 g/dL — ABNORMAL LOW (ref 30.0–36.0)
MCV: 72.9 fL — ABNORMAL LOW (ref 80.0–100.0)
Monocytes Absolute: 0.6 K/uL (ref 0.1–1.0)
Monocytes Relative: 9 %
Neutro Abs: 2.8 K/uL (ref 1.7–7.7)
Neutrophils Relative %: 41 %
Platelets: 255 K/uL (ref 150–400)
RBC: 4.39 MIL/uL (ref 4.22–5.81)
RDW: 20.4 % — ABNORMAL HIGH (ref 11.5–15.5)
WBC: 7.1 K/uL (ref 4.0–10.5)
nRBC: 0 % (ref 0.0–0.2)

## 2024-05-22 LAB — LIPASE, BLOOD: Lipase: 29 U/L (ref 11–51)

## 2024-05-22 LAB — PROTIME-INR
INR: 1.1 (ref 0.8–1.2)
Prothrombin Time: 14.3 s (ref 11.4–15.2)

## 2024-05-22 LAB — TYPE AND SCREEN
ABO/RH(D): O POS
Antibody Screen: NEGATIVE

## 2024-05-22 MED ORDER — SENNOSIDES-DOCUSATE SODIUM 8.6-50 MG PO TABS: 1.0000 | ORAL_TABLET | Freq: Every evening | ORAL | Status: DC | PRN

## 2024-05-22 MED ORDER — ONDANSETRON HCL 4 MG PO TABS: 4.0000 mg | ORAL_TABLET | Freq: Four times a day (QID) | ORAL | Status: DC | PRN

## 2024-05-22 MED ORDER — SODIUM CHLORIDE 0.9 % IV SOLN: INTRAVENOUS | Status: DC

## 2024-05-22 MED ORDER — MORPHINE SULFATE (PF) 4 MG/ML IV SOLN
4.0000 mg | Freq: Once | INTRAVENOUS | Status: AC
  Administered 2024-05-22: 4 mg via INTRAVENOUS
  Filled 2024-05-22: qty 1

## 2024-05-22 MED ORDER — SODIUM CHLORIDE 0.9 % IV SOLN: INTRAVENOUS | Status: AC

## 2024-05-22 MED ORDER — ACETAMINOPHEN 325 MG PO TABS: 650.0000 mg | ORAL_TABLET | Freq: Four times a day (QID) | ORAL | Status: DC | PRN

## 2024-05-22 MED ORDER — PANTOPRAZOLE SODIUM 40 MG IV SOLR
40.0000 mg | Freq: Two times a day (BID) | INTRAVENOUS | Status: DC
  Administered 2024-05-23 – 2024-05-25 (×4): 40 mg via INTRAVENOUS
  Filled 2024-05-22 (×5): qty 10

## 2024-05-22 MED ORDER — HYDROMORPHONE HCL 1 MG/ML IJ SOLN
0.5000 mg | Freq: Four times a day (QID) | INTRAMUSCULAR | Status: DC | PRN
  Administered 2024-05-22 – 2024-05-23 (×2): 0.5 mg via INTRAVENOUS
  Filled 2024-05-22 (×2): qty 0.5

## 2024-05-22 MED ORDER — LACTATED RINGERS IV BOLUS
1000.0000 mL | Freq: Once | INTRAVENOUS | Status: AC
  Administered 2024-05-22: 1000 mL via INTRAVENOUS

## 2024-05-22 MED ORDER — PANTOPRAZOLE SODIUM 40 MG IV SOLR
40.0000 mg | INTRAVENOUS | Status: AC
  Administered 2024-05-22 (×2): 40 mg via INTRAVENOUS
  Filled 2024-05-22 (×2): qty 10

## 2024-05-22 MED ORDER — IOHEXOL 350 MG/ML SOLN
100.0000 mL | Freq: Once | INTRAVENOUS | Status: AC | PRN
  Administered 2024-05-22: 100 mL via INTRAVENOUS

## 2024-05-22 MED ORDER — IOHEXOL 350 MG/ML SOLN: 100.0000 mL | Freq: Once | INTRAVENOUS | Status: DC | PRN

## 2024-05-22 MED ORDER — ONDANSETRON HCL 4 MG/2ML IJ SOLN
4.0000 mg | Freq: Four times a day (QID) | INTRAMUSCULAR | Status: DC | PRN
  Administered 2024-05-22: 4 mg via INTRAVENOUS
  Filled 2024-05-22: qty 2

## 2024-05-22 MED ORDER — ONDANSETRON HCL 4 MG/2ML IJ SOLN
4.0000 mg | Freq: Once | INTRAMUSCULAR | Status: AC
  Administered 2024-05-22: 4 mg via INTRAVENOUS
  Filled 2024-05-22: qty 2

## 2024-05-22 MED ORDER — ACETAMINOPHEN 650 MG RE SUPP: 650.0000 mg | Freq: Four times a day (QID) | RECTAL | Status: DC | PRN

## 2024-05-22 NOTE — H&P (Signed)
 History and Physical  Joshua Gutierrez FMW:[id number] DOB: 1986-09-08 DOA: 05/22/2024  PCP: Patient, No Pcp Per   Chief Complaint: Abdominal pain, hematemesis  HPI: Joshua Gutierrez is a 38 y.o. male with medical history significant for recently diagnosed gastritis and gastric ulcer on EGD now presenting to the ED with epigastric pain, coffee-ground emesis and stool.   ED Course: Initial vitals overall stable. Initial labs significant for K+ 3.1, WBC 7.1, Hgb 9.3, platelet 255, normal INR, urinalysis with no signs of infection.  CTA GI bleed study shows questionable outpouching involving the distal stomach concerning for gastric ulcer but no discrete perforation.  Pt received IV LR 1 L bolus, IV morphine , IV Zofran  and IV Protonix  40 mg x 1.  [facility] was consulted for evaluation. [facility] was consulted for admission.   Review of Systems: Please see HPI for pertinent positives and negatives. A complete 10 system review of systems are otherwise negative.  Past Medical History:  Diagnosis Date   Gastric ulcer    Past Surgical History:  Procedure Laterality Date   ESOPHAGOGASTRODUODENOSCOPY N/A 04/24/2024   Procedure: EGD (ESOPHAGOGASTRODUODENOSCOPY);  Surgeon: Dianna Specking, MD;  Location: [facility];  Service: Gastroenterology;  Laterality: N/A;   Social History:  reports that he has been smoking cigarettes. He has never used smokeless tobacco. He reports current alcohol use. He reports that he does not use drugs.  Allergies  Allergen Reactions   Ambien [Zolpidem] Other (See Comments)    Seizures and Dizziness (Patient did not acknowledge this in 2025, however)    History reviewed. No pertinent family history.   Prior to Admission medications   Medication Sig Start Date End Date Taking? Authorizing Provider  pantoprazole  (PROTONIX ) 40 MG tablet Take 1 tablet (40 mg total) by mouth 2 (two) times daily. Patient not taking: Reported on 05/22/2024 04/27/24 08/25/24  Cindy Garnette POUR, MD   diphenhydrAMINE  (BENADRYL ) 25 MG tablet Take 1 tablet (25 mg total) by mouth every 8 (eight) hours as needed for itching. 07/16/15 07/11/19  Randol Simmonds, MD    Physical Exam: BP 138/88   Pulse 95   Temp 98.1 F (36.7 C)   Resp 18   Ht 5' 6 (1.676 m)   Wt 63.5 kg   SpO2 100%   BMI 22.60 kg/m  General: Pleasant, well-appearing *** laying in bed. No acute distress. HEENT: [place]/AT. Anicteric sclera CV: RRR. No murmurs, rubs, or gallops. No LE edema Pulmonary: Lungs CTAB. Normal effort. No wheezing or rales. Abdominal: Soft, nontender, nondistended. Normal bowel sounds. Extremities: Palpable radial and DP pulses. Normal ROM. Skin: Warm and dry. No obvious rash or lesions. Neuro: A&Ox3. Moves all extremities. Normal sensation to light touch. No focal deficit. Psych: Normal mood and affect          Labs on Admission:  Basic Metabolic Panel: Recent Labs  Lab 05/22/24 1456  NA 137  K 3.1*  CL 102  CO2 27  GLUCOSE 108*  BUN 7  CREATININE 0.70  CALCIUM 8.7*   Liver Function Tests: Recent Labs  Lab 05/22/24 1456  AST 16  ALT 13  ALKPHOS 59  BILITOT 0.5  PROT 7.5  ALBUMIN 3.7   Recent Labs  Lab 05/22/24 1456  LIPASE 29   No results for input(s): AMMONIA in the last 168 hours. CBC: Recent Labs  Lab 05/22/24 1456  WBC 7.1  NEUTROABS 2.8  HGB 9.3*  HCT 32.0*  MCV 72.9*  PLT 255   Cardiac Enzymes: No results for input(s): CKTOTAL,  CKMB, CKMBINDEX, TROPONINI in the last 168 hours. BNP (last 3 results) No results for input(s): BNP in the last 8760 hours.  ProBNP (last 3 results) No results for input(s): PROBNP in the last 8760 hours.  CBG: No results for input(s): GLUCAP in the last 168 hours.  Radiological Exams on Admission: CT ANGIO GI BLEED Result Date: 05/22/2024 CLINICAL DATA:  Provided history: Lower GI bleed. Technologist notes state recent gastric ulcer, repaired 2 weeks ago, not taking prescribed medication. Cough for ground  vomiting and dark stool. EXAM: CTA ABDOMEN AND PELVIS WITHOUT AND WITH CONTRAST TECHNIQUE: Multidetector CT imaging of the abdomen and pelvis was performed using the standard protocol during bolus administration of intravenous contrast. Multiplanar reconstructed images and MIPs were obtained and reviewed to evaluate the vascular anatomy. RADIATION DOSE REDUCTION: This exam was performed according to the departmental dose-optimization program which includes automated exposure control, adjustment of the mA and/or kV according to patient size and/or use of iterative reconstruction technique. CONTRAST:  OMNIPAQUE  IOHEXOL  350 MG/ML SOLN COMPARISON:  Abdominopelvic CT 04/24/2024 FINDINGS: VASCULAR Aorta: Normal caliber aorta without aneurysm, dissection, vasculitis or significant stenosis. Minimal aortic atherosclerosis. Celiac: Patent without evidence of aneurysm, dissection, vasculitis or significant stenosis. SMA: Patent without evidence of aneurysm, dissection, vasculitis or significant stenosis. Renals: 2 right and single left renal arteries. All renal arteries are patent without evidence of aneurysm, dissection, vasculitis, fibromuscular dysplasia or significant stenosis. IMA: Patent without evidence of aneurysm, dissection, vasculitis or significant stenosis. Inflow: Patent without evidence of aneurysm, dissection, vasculitis or significant stenosis. Proximal Outflow: Bilateral common femoral and visualized portions of the superficial and profunda femoral arteries are patent without evidence of aneurysm, dissection, vasculitis or significant stenosis. Veins: Venous phase imaging demonstrates patency of the portal, splenic, and mesenteric veins. The iliac veins and IVC are patent. Review of the MIP images confirms the above findings. NON-VASCULAR Lower chest: No basilar airspace disease or pleural effusion. Hepatobiliary: No focal liver abnormality is seen. No gallstones, gallbladder wall thickening, or  biliary dilatation. Pancreas: No ductal dilatation or inflammation. Spleen: Normal in size without focal abnormality. Adrenals/Urinary Tract: No adrenal nodule. No hydronephrosis or renal calculi. Unremarkable urinary bladder. Stomach/Bowel: No contrast accumulation in the GI tract to localize site of GI bleed. Gastric mucosal enhancement with questionable outpouching involving the distal stomach series 18, image 25 concerning for gastric ulcer. Mild edema of the adjacent fat but no discrete perforation. Free air. No small bowel distension or obstruction. Small to moderate colonic stool burden. Normal appendix. Lymphatic: Few prominent upper abdominal nodes including a 6 mm mesenteric node series 18, image 28. 7 mm perigastric node series 18, image 27. Reproductive: Prostate is unremarkable. Other: Small amount of simple free fluid in the dependent pelvis. No free intra-abdominal air. Musculoskeletal: There are no acute or suspicious osseous abnormalities. IMPRESSION: 1. No contrast accumulation in the GI tract to localize site of GI bleed. 2. Gastric mucosal enhancement with questionable outpouching involving the distal stomach concerning for gastric ulcer. Mild edema of the adjacent fat but no discrete perforation. 3. Few prominent upper abdominal nodes, likely reactive. 4. Small amount of simple free fluid in the dependent pelvis. Aortic Atherosclerosis (ICD10-I70.0). Electronically Signed   By: Andrea Gasman M.D.   On: 05/22/2024 21:07   Assessment/Plan Charan Prieto is a 38 y.o. male with medical history significant for diagnosed gastritis and gastric ulcer on EGD now presenting to the ED with epigastric pain, coffee-ground emesis and stool not admitted for GI bleed.   #  GI bleed #  - Hgb of *** on admission from baseline of *** - Pt presented with *** - FOBT ***, Pt *** - *** - IV Protonix  *** - Follow up iron studies, ferritin and vitamin B12 - Trend CBC and Transfuse for Hgb goal > 7  #  Gastritis # Epigastric pain # Hx of gastric ulcer - Continue IV Protonix  - IV Dilaudid  as needed for pain    DVT prophylaxis: SCDs    Code Status: Full Code  Consults called: GI  Family Communication: No family at bedside  Severity of Illness: The appropriate patient status for this patient is INPATIENT. Inpatient status is judged to be reasonable and necessary in order to provide the required intensity of service to ensure the patient's safety. The patient's presenting symptoms, physical exam findings, and initial radiographic and laboratory data in the context of their chronic comorbidities is felt to place them at high risk for further clinical deterioration. Furthermore, it is not anticipated that the patient will be medically stable for discharge from the hospital within 2 midnights of admission.   * I certify that at the point of admission it is my clinical judgment that the patient will require inpatient hospital care spanning beyond 2 midnights from the point of admission due to high intensity of service, high risk for further deterioration and high frequency of surveillance required.*  Level of care: Med-Surg   This record has been created using Conservation officer, historic buildings. Errors have been sought and corrected, but may not always be located. Such creation errors do not reflect on the standard of care.   Lou Claretta HERO, MD 05/22/2024, 10:01 PM [facility] Pager: [phone] Isaiah 41:10   If 7PM-7AM, please contact night-coverage [email or web address] Password [email or web address]

## 2024-05-22 NOTE — ED Provider Notes (Signed)
 [place] EMERGENCY DEPARTMENT AT [facility] Provider Note   CSN: [id number] Arrival date & time: 05/22/24  1445     Patient presents with: Abdominal Pain   Joshua Gutierrez is a 38 y.o. male.  {Add pertinent medical, surgical, social history, OB history to HPI:32947} HPI      38 year old male with a history of peptic ulcer disease, Bell's palsy, recent admission at the end of June with concern for deep ulcer in the gastric antrum and prepyloric region with hemorrhage, hemorrhagic anemia with a hemoglobin down to 6.5 on admission, who presents with concern for abdominal pain, nausea and vomiting with coffee-ground emesis and dark tarry stool.  Abdominal pain for 3 days, epigastric, 10/10 pain Nausea, vomiting began this morning, coffee grounds x2, similar to recent UGI admission 2 dark tarry stool today Lightheaded No fever, no urinary symptoms Headache Took the rx they gave them at discharge but off meds for the last week Not drinking etoh anymore, quit 7-8 years ago No other drug use No ibuprofen    Prior to Admission medications   Medication Sig Start Date End Date Taking? Authorizing Provider  pantoprazole  (PROTONIX ) 40 MG tablet Take 1 tablet (40 mg total) by mouth 2 (two) times daily. 04/27/24 08/25/24  Cindy Garnette POUR, MD  diphenhydrAMINE  (BENADRYL ) 25 MG tablet Take 1 tablet (25 mg total) by mouth every 8 (eight) hours as needed for itching. 07/16/15 07/11/19  Randol Simmonds, MD    Allergies: Ambien [zolpidem]    Review of Systems  Updated Vital Signs BP 138/88   Pulse 95   Temp 98.1 F (36.7 C)   Resp 18   Ht 5' 6 (1.676 m)   Wt 63.5 kg   SpO2 100%   BMI 22.60 kg/m   Physical Exam  (all labs ordered are listed, but only abnormal results are displayed) Labs Reviewed  COMPREHENSIVE METABOLIC PANEL WITH GFR - Abnormal; Notable for the following components:      Result Value   Potassium 3.1 (*)    Glucose, Bld 108 (*)    Calcium 8.7 (*)    All  other components within normal limits  CBC WITH DIFFERENTIAL/PLATELET - Abnormal; Notable for the following components:   Hemoglobin 9.3 (*)    HCT 32.0 (*)    MCV 72.9 (*)    MCH 21.2 (*)    MCHC 29.1 (*)    RDW 20.4 (*)    Eosinophils Absolute 1.9 (*)    All other components within normal limits  LIPASE, BLOOD  URINALYSIS, ROUTINE W REFLEX MICROSCOPIC  PROTIME-INR  TYPE AND SCREEN    EKG: None  Radiology: No results found.  {Document cardiac monitor, telemetry assessment procedure when appropriate:32947} Procedures   Medications Ordered in the ED  lactated ringers  bolus 1,000 mL (has no administration in time range)  pantoprazole  (PROTONIX ) injection 40 mg (has no administration in time range)    Followed by  pantoprazole  (PROTONIX ) injection 40 mg (has no administration in time range)  morphine  (PF) 4 MG/ML injection 4 mg (has no administration in time range)  ondansetron  (ZOFRAN ) injection 4 mg (has no administration in time range)      {Click here for ABCD2, HEART and other calculators REFRESH Note before signing:1}                               38 year old male with a history of peptic ulcer disease, Bell's palsy, recent admission at  the end of June with concern for deep ulcer in the gastric antrum and prepyloric region with hemorrhage, hemorrhagic anemia with a hemoglobin down to 6.5 on admission, who presents with concern for abdominal pain, nausea and vomiting with coffee-ground emesis and dark tarry stool.  {Document critical care time when appropriate  Document review of labs and clinical decision tools ie CHADS2VASC2, etc  Document your independent review of radiology images and any outside records  Document your discussion with family members, caretakers and with consultants  Document social determinants of health affecting pt's care  Document your decision making why or why not admission, treatments were needed:32947:::1}   Final diagnoses:  None    ED  Discharge Orders     None

## 2024-05-22 NOTE — ED Notes (Signed)
 Patient is requesting medication

## 2024-05-22 NOTE — ED Notes (Signed)
 Pt stated he can not give a sample at this time, pt has a urinal

## 2024-05-22 NOTE — ED Notes (Signed)
 Patient is resting at this time in the bed. Awaiting CT Scan

## 2024-05-22 NOTE — ED Triage Notes (Signed)
 Pt BIB EMS with epigastric abdominal pain. Pt had a gastric ulcer that was repaired 2 weeks ago, pt has not been taking his prescribed medications. Pt is having coffee ground vomiting with dark tarry stool.   18 left ac  4 mg zofran   800 ml lr 100 mcg of Fentanyl

## 2024-05-22 NOTE — ED Notes (Signed)
 Patient taken to CT.

## 2024-05-23 DIAGNOSIS — R1013 Epigastric pain: Secondary | ICD-10-CM

## 2024-05-23 DIAGNOSIS — K253 Acute gastric ulcer without hemorrhage or perforation: Secondary | ICD-10-CM

## 2024-05-23 DIAGNOSIS — E876 Hypokalemia: Secondary | ICD-10-CM

## 2024-05-23 LAB — BASIC METABOLIC PANEL WITH GFR
Anion gap: 4 — ABNORMAL LOW (ref 5–15)
BUN: 5 mg/dL — ABNORMAL LOW (ref 6–20)
CO2: 28 mmol/L (ref 22–32)
Calcium: 8.6 mg/dL — ABNORMAL LOW (ref 8.9–10.3)
Chloride: 102 mmol/L (ref 98–111)
Creatinine, Ser: 0.81 mg/dL (ref 0.61–1.24)
GFR, Estimated: >60 mL/min (ref >60)
Glucose, Bld: 95 mg/dL (ref 70–99)
Potassium: 3 mmol/L — ABNORMAL LOW (ref 3.5–5.1)
Sodium: 134 mmol/L — ABNORMAL LOW (ref 135–145)

## 2024-05-23 LAB — IRON AND TIBC
Iron: 18 ug/dL — ABNORMAL LOW (ref 45–182)
Saturation Ratios: 4 % — ABNORMAL LOW (ref 17.9–39.5)
TIBC: 475 ug/dL — ABNORMAL HIGH (ref 250–450)
UIBC: 457 ug/dL

## 2024-05-23 LAB — CBC
HCT: 31.3 % — ABNORMAL LOW (ref 39.0–52.0)
Hemoglobin: 8.9 g/dL — ABNORMAL LOW (ref 13.0–17.0)
MCH: 20.7 pg — ABNORMAL LOW (ref 26.0–34.0)
MCHC: 28.4 g/dL — ABNORMAL LOW (ref 30.0–36.0)
MCV: 72.8 fL — ABNORMAL LOW (ref 80.0–100.0)
Platelets: 260 K/uL (ref 150–400)
RBC: 4.3 MIL/uL (ref 4.22–5.81)
RDW: 20.3 % — ABNORMAL HIGH (ref 11.5–15.5)
WBC: 7.9 K/uL (ref 4.0–10.5)
nRBC: 0 % (ref 0.0–0.2)

## 2024-05-23 LAB — MAGNESIUM: Magnesium: 2 mg/dL (ref 1.7–2.4)

## 2024-05-23 LAB — FERRITIN: Ferritin: 5 ng/mL — ABNORMAL LOW (ref 24–336)

## 2024-05-23 LAB — VITAMIN B12: Vitamin B-12: 206 pg/mL (ref 180–914)

## 2024-05-23 MED ORDER — POTASSIUM CHLORIDE CRYS ER 20 MEQ PO TBCR
40.0000 meq | EXTENDED_RELEASE_TABLET | Freq: Once | ORAL | Status: AC
  Administered 2024-05-23: 40 meq via ORAL

## 2024-05-23 MED ORDER — SUCRALFATE 1 GM/10ML PO SUSP
1.0000 g | Freq: Three times a day (TID) | ORAL | Status: DC
  Administered 2024-05-24 – 2024-05-25 (×5): 1 g via ORAL
  Filled 2024-05-23 (×7): qty 10

## 2024-05-23 NOTE — TOC Initial Note (Signed)
 Transition of Care [facility]) - Initial/Assessment Note    Patient Details  Name: Joshua Gutierrez MRN: [id number] Date of Birth: [birth year]  [facility] CM/SW Contact:    Doneta Glenys DASEN, RN Phone Number: 05/23/2024, 2:52 PM  Clinical Narrative:                 Presented for GI bleed. CM spoke with patient in room, states PTA lives in a house with his mother, is unemployed and has transportation; Agreeable to [employer] scheduling PCP appointment, adding resources for signing up for Medicaid and housing. Denies DME, HH and oxygen; patients mother will transport at discharge.  No additional needs identified during visit. TOC signing off. Please place consult if needs present.  Expected Discharge Plan: Home/Self Care Barriers to Discharge: Continued Medical Work up, Inadequate or no insurance, Other (must enter comment) (NO PCP & NO Insurance)   Patient Goals and CMS Choice Patient states their goals for this hospitalization and ongoing recovery are:: Home with mother CMS Medicare.gov Compare Post Acute Care list provided to::  (NA) Choice offered to / list presented to : NA [place] ownership interest in [facility].provided to:: Parent NA    Expected Discharge Plan and Services In-house Referral: NA Discharge Planning Services: CM Consult Post Acute Care Choice: NA Living arrangements for the past 2 months: Single Family Home                 DME Arranged: N/A DME Agency: NA       HH Arranged: NA HH Agency: NA        Prior Living Arrangements/Services Living arrangements for the past 2 months: Single Family Home Lives with:: Parents Patient language and need for interpreter reviewed:: Yes Do you feel safe going back to the place where you live?: Yes      Need for Family Participation in Patient Care: No (Comment) Care giver support system in place?: Yes (comment) Current home services:  (NA) Criminal Activity/Legal Involvement Pertinent to Current  Situation/Hospitalization: No - Comment as needed  Activities of Daily Living   ADL Screening (condition at time of admission) Independently performs ADLs?: Yes (appropriate for developmental age) Is the patient deaf or have difficulty hearing?: No Does the patient have difficulty seeing, even when wearing glasses/contacts?: No  Permission Sought/Granted Permission sought to share information with : Case Manager Permission granted to share information with : Yes, Verbal Permission Granted  Share Information with NAME: Willman, Cuny (Mother)  [phone]  Permission granted to share info w AGENCY: [facility] Providers        Emotional Assessment Appearance:: Appears stated age Attitude/Demeanor/Rapport: Engaged, Gracious Affect (typically observed): Appropriate Orientation: : Oriented to Self, Oriented to Place, Oriented to  Time, Oriented to Situation Alcohol / Substance Use: Illicit Drugs Psych Involvement: No (comment)  Admission diagnosis:  GI bleed [K92.2] Patient Active Problem List   Diagnosis Date Noted   Epigastric pain 05/23/2024   Acute gastric ulcer without hemorrhage or perforation 05/23/2024   Hypokalemia 05/23/2024   GI bleed 05/22/2024   Upper GI bleed 04/24/2024   Blood loss anemia 04/23/2024   PCP:  Patient, No Pcp Per Pharmacy:   [id number] - [place], [place] - [address] AT [facility] [address] [place] [address] Phone: [phone] Fax: [phone]  [facility] [address] [place] [address] Phone: [phone] Fax: [phone]     Social Drivers of Health (SDOH) Social  History: SDOH Screenings   Food Insecurity: Food Insecurity Present (05/23/2024)  Housing: Low Risk  (05/23/2024)  Transportation Needs: Unmet Transportation Needs (05/23/2024)  Utilities: Not At Risk (05/23/2024)  Tobacco Use: High Risk (05/22/2024)   SDOH Interventions:     Readmission Risk  Interventions    05/23/2024    2:44 PM  Readmission Risk Prevention Plan  Post Dischage Appt Complete  Medication Screening Complete  Transportation Screening Complete

## 2024-05-23 NOTE — Progress Notes (Signed)
 PROGRESS NOTE    Joshua Gutierrez  FMW:[id number] DOB: 1985/11/21 DOA: 05/22/2024 PCP: Patient, No Pcp Per   Brief Narrative:  Joshua Gutierrez is a 38 y.o. male with medical history significant for recently diagnosed gastritis and gastric ulcer on EGD now presenting to the ED with epigastric pain, coffee-ground emesis and stool.    Assessment & Plan:   Principal Problem:   GI bleed Active Problems:   Epigastric pain   Acute gastric ulcer without hemorrhage or perforation   Hypokalemia  Rule out acute GI bleed Chronic iron deficiency anemia  - Hgb of 9.3 on admission from baseline of 11-12 - Pt presented with coffee-ground emesis and stool - Recent EGD showed gastric ulcer and gastritis - Abdominal CT shows findings concerning for gastric ulcer but no discrete perforation - [facility] following, appreciate insight recommendations, no indication for intervention or endoscopy at this time   Gastritis Epigastric pain Hx of gastric ulcer - Presented with 3 days of progressive epigastric pain likely secondary to gastric ulcer - Continue IV Protonix    Hypokalemia - Replete - recheck in am  DVT prophylaxis: SCDs Start: 05/22/24 2157 Code Status:   Code Status: Full Code Family Communication: None present  Status is: Inpt  Dispo: The patient is from: Home              Anticipated d/c is to: Home              Anticipated d/c date is: 24 to 48 hours              Patient currently not medically stable for discharge  Consultants:  [facility], [facility]  Procedures:  None  Antimicrobials:  None  Subjective: No acute issues or events overnight  Objective: Vitals:   05/22/24 1458 05/22/24 1831 05/22/24 2228 05/23/24 0407  BP: (!) 136/99 138/88 119/89 111/68  Pulse: 85 95 84 76  Resp: 18 18 18 16   Temp: 98.5 F (36.9 C) 98.1 F (36.7 C) 98.1 F (36.7 C) 97.8 F (36.6 C)  TempSrc: Oral  Oral   SpO2: 100% 100% 100% 100%  Weight:      Height:       No intake or output data in the 24  hours ending 05/23/24 0812 Filed Weights   05/22/24 1454  Weight: 63.5 kg    Examination:  General:  Pleasantly resting in bed, No acute distress. HEENT:  Normocephalic atraumatic.  Sclerae nonicteric, noninjected.  Extraocular movements intact bilaterally. Neck:  Without mass or deformity.  Trachea is midline. Lungs:  Clear to auscultate bilaterally without rhonchi, wheeze, or rales. Heart:  Regular rate and rhythm.  Without murmurs, rubs, or gallops. Abdomen:  Soft, nontender, nondistended.  Without guarding or rebound. Extremities: Without cyanosis, clubbing, edema, or obvious deformity. Skin:  Warm and dry, no erythema.  Data Reviewed: I have personally reviewed following labs and imaging studies  CBC: Recent Labs  Lab 05/22/24 1456 05/23/24 0436  WBC 7.1 7.9  NEUTROABS 2.8  --   HGB 9.3* 8.9*  HCT 32.0* 31.3*  MCV 72.9* 72.8*  PLT 255 260   Basic Metabolic Panel: Recent Labs  Lab 05/22/24 1456 05/23/24 0436  NA 137 134*  K 3.1* 3.0*  CL 102 102  CO2 27 28  GLUCOSE 108* 95  BUN 7 5*  CREATININE 0.70 0.81  CALCIUM 8.7* 8.6*  MG  --  2.0   GFR: Estimated Creatinine Clearance: 112.1 mL/min (by C-G formula based on SCr of 0.81 mg/dL). Liver  Function Tests: Recent Labs  Lab 05/22/24 1456  AST 16  ALT 13  ALKPHOS 59  BILITOT 0.5  PROT 7.5  ALBUMIN 3.7   Recent Labs  Lab 05/22/24 1456  LIPASE 29    Coagulation Profile: Recent Labs  Lab 05/22/24 1916  INR 1.1   Anemia Panel: Recent Labs    05/23/24 0436  VITAMINB12 206  FERRITIN 5*  TIBC 475*  IRON 18*    No results found for this or any previous visit (from the past 240 hours).   Radiology Studies: CT ANGIO GI BLEED Result Date: 05/22/2024 CLINICAL DATA:  Provided history: Lower GI bleed. Technologist notes state recent gastric ulcer, repaired 2 weeks ago, not taking prescribed medication. Cough for ground vomiting and dark stool. EXAM: CTA ABDOMEN AND PELVIS WITHOUT AND WITH CONTRAST  TECHNIQUE: Multidetector CT imaging of the abdomen and pelvis was performed using the standard protocol during bolus administration of intravenous contrast. Multiplanar reconstructed images and MIPs were obtained and reviewed to evaluate the vascular anatomy. RADIATION DOSE REDUCTION: This exam was performed according to the departmental dose-optimization program which includes automated exposure control, adjustment of the mA and/or kV according to patient size and/or use of iterative reconstruction technique. CONTRAST:  OMNIPAQUE  IOHEXOL  350 MG/ML SOLN COMPARISON:  Abdominopelvic CT 04/24/2024 FINDINGS: VASCULAR Aorta: Normal caliber aorta without aneurysm, dissection, vasculitis or significant stenosis. Minimal aortic atherosclerosis. Celiac: Patent without evidence of aneurysm, dissection, vasculitis or significant stenosis. SMA: Patent without evidence of aneurysm, dissection, vasculitis or significant stenosis. Renals: 2 right and single left renal arteries. All renal arteries are patent without evidence of aneurysm, dissection, vasculitis, fibromuscular dysplasia or significant stenosis. IMA: Patent without evidence of aneurysm, dissection, vasculitis or significant stenosis. Inflow: Patent without evidence of aneurysm, dissection, vasculitis or significant stenosis. Proximal Outflow: Bilateral common femoral and visualized portions of the superficial and profunda femoral arteries are patent without evidence of aneurysm, dissection, vasculitis or significant stenosis. Veins: Venous phase imaging demonstrates patency of the portal, splenic, and mesenteric veins. The iliac veins and IVC are patent. Review of the MIP images confirms the above findings. NON-VASCULAR Lower chest: No basilar airspace disease or pleural effusion. Hepatobiliary: No focal liver abnormality is seen. No gallstones, gallbladder wall thickening, or biliary dilatation. Pancreas: No ductal dilatation or inflammation. Spleen: Normal in  size without focal abnormality. Adrenals/Urinary Tract: No adrenal nodule. No hydronephrosis or renal calculi. Unremarkable urinary bladder. Stomach/Bowel: No contrast accumulation in the GI tract to localize site of GI bleed. Gastric mucosal enhancement with questionable outpouching involving the distal stomach series 18, image 25 concerning for gastric ulcer. Mild edema of the adjacent fat but no discrete perforation. Free air. No small bowel distension or obstruction. Small to moderate colonic stool burden. Normal appendix. Lymphatic: Few prominent upper abdominal nodes including a 6 mm mesenteric node series 18, image 28. 7 mm perigastric node series 18, image 27. Reproductive: Prostate is unremarkable. Other: Small amount of simple free fluid in the dependent pelvis. No free intra-abdominal air. Musculoskeletal: There are no acute or suspicious osseous abnormalities. IMPRESSION: 1. No contrast accumulation in the GI tract to localize site of GI bleed. 2. Gastric mucosal enhancement with questionable outpouching involving the distal stomach concerning for gastric ulcer. Mild edema of the adjacent fat but no discrete perforation. 3. Few prominent upper abdominal nodes, likely reactive. 4. Small amount of simple free fluid in the dependent pelvis. Aortic Atherosclerosis (ICD10-I70.0). Electronically Signed   By: Andrea Gasman M.D.   On: 05/22/2024 21:07  Scheduled Meds:  pantoprazole  (PROTONIX ) IV  40 mg Intravenous Q12H   Continuous Infusions:  sodium chloride  100 mL/hr at 05/22/24 2233     LOS: 1 day   Time spent: 55 min  Elsie JAYSON Mont[place], DO [facility]  If 7PM-7AM, please contact night-coverage [email or web address]  05/23/2024, 8:12 AM

## 2024-05-23 NOTE — Plan of Care (Signed)

## 2024-05-23 NOTE — Discharge Instructions (Signed)
 [facility] Department of Health and [employer] Aging and adult services Children's services Deaf-Blind services Disability services Guardianship Hearing loss (assistive technology, interpreters, etc.) Low-income services (health care, child care, housing, financial and nutrition assistance) Medicaid Pregnancy services Utility assistance Veteran's services [phone] [address] [place], [place] [address]

## 2024-05-23 NOTE — Consult Note (Signed)
 [facility] Consultation Note  Referring Provider: [facility] Primary Care Physician:  Patient, No Pcp Per Primary Gastroenterologist:  [place]  Reason for Consultation:  coffee ground emesis  HPI: Joshua Gutierrez is a 38 y.o. male admitted coffee ground emesis and coffee grounds in stool.  Started few days ago, in turn a couple days after he ran out of pantoprazole .  Had similar admission last month, had EGD showing pyloric channel ulcer with adherent clot which was treated with hemospray.  No bright red emesis or hematochezia.  No abdominal pain.  Denies NSAIDs.   Past Medical History:  Diagnosis Date   Gastric ulcer     Past Surgical History:  Procedure Laterality Date   ESOPHAGOGASTRODUODENOSCOPY N/A 04/24/2024   Procedure: EGD (ESOPHAGOGASTRODUODENOSCOPY);  Surgeon: Dianna Specking, MD;  Location: [facility];  Service: Gastroenterology;  Laterality: N/A;    Prior to Admission medications   Medication Sig Start Date End Date Taking? Authorizing Provider  pantoprazole  (PROTONIX ) 40 MG tablet Take 1 tablet (40 mg total) by mouth 2 (two) times daily. Patient not taking: Reported on 05/22/2024 04/27/24 08/25/24  Cindy Garnette POUR, MD  diphenhydrAMINE  (BENADRYL ) 25 MG tablet Take 1 tablet (25 mg total) by mouth every 8 (eight) hours as needed for itching. 07/16/15 07/11/19  Randol Simmonds, MD    Current Facility-Administered Medications  Medication Dose Route Frequency Provider Last Rate Last Admin   0.9 %  sodium chloride  infusion   Intravenous Continuous Amponsah, Prosper M, MD 100 mL/hr at 05/23/24 0933 New Bag at 05/23/24 0933   acetaminophen  (TYLENOL ) tablet 650 mg  650 mg Oral Q6H PRN Amponsah, Prosper M, MD       Or   acetaminophen  (TYLENOL ) suppository 650 mg  650 mg Rectal Q6H PRN Lou Claretta HERO, MD       HYDROmorphone  (DILAUDID ) injection 0.5 mg  0.5 mg Intravenous Q6H PRN Amponsah, Prosper M, MD   0.5 mg at 05/23/24 0940   ondansetron  (ZOFRAN ) tablet 4 mg   4 mg Oral Q6H PRN Amponsah, Prosper M, MD       Or   ondansetron  (ZOFRAN ) injection 4 mg  4 mg Intravenous Q6H PRN Amponsah, Prosper M, MD   4 mg at 05/22/24 2233   pantoprazole  (PROTONIX ) injection 40 mg  40 mg Intravenous Q12H Dreama Longs, MD   40 mg at 05/23/24 0650   senna-docusate (Senokot-S) tablet 1 tablet  1 tablet Oral QHS PRN Lou Claretta HERO, MD        Allergies as of 05/22/2024 - Review Complete 05/22/2024  Allergen Reaction Noted   Ambien [zolpidem] Other (See Comments) 11/13/2013    History reviewed. No pertinent family history.  Social History   Socioeconomic History   Marital status: Single    Spouse name: Not on file   Number of children: Not on file   Years of education: Not on file   Highest education level: Not on file  Occupational History   Not on file  Tobacco Use   Smoking status: Every Day    Current packs/day: 0.50    Types: Cigarettes   Smokeless tobacco: Never  Vaping Use   Vaping status: Never Used  Substance and Sexual Activity   Alcohol use: Yes    Comment: occasionally   Drug use: No   Sexual activity: Not on file  Other Topics Concern   Not on file  Social History Narrative   Not on file   Social Drivers of Health   Financial Resource Strain: Not  on file  Food Insecurity: Food Insecurity Present (05/23/2024)   Hunger Vital Sign    Worried About Running Out of Food in the Last Year: Often true    Ran Out of Food in the Last Year: Often true  Transportation Needs: Unmet Transportation Needs (05/23/2024)   PRAPARE - Administrator, Civil Service (Medical): Yes    Lack of Transportation (Non-Medical): Yes  Physical Activity: Not on file  Stress: Not on file  Social Connections: Not on file  Intimate Partner Violence: Not At Risk (05/23/2024)   Humiliation, Afraid, Rape, and Kick questionnaire    Fear of Current or Ex-Partner: No    Emotionally Abused: No    Physically Abused: No    Sexually Abused: No     Review of Systems: As per HPI, all others negative  Physical Exam: Vital signs in last 24 hours: Temp:  [97.8 F (36.6 C)-98.5 F (36.9 C)] 97.8 F (36.6 C) (07/24 1345) Pulse Rate:  [65-95] 65 (07/24 1345) Resp:  [16-18] 17 (07/24 1345) BP: (111-138)/(68-99) 113/82 (07/24 1345) SpO2:  [100 %] 100 % (07/24 1345) Weight:  [63.5 kg] 63.5 kg (07/23 1454)   General:   Alert,  Well-developed, well-nourished, pleasant and cooperative in NAD Head:  Normocephalic and atraumatic. Eyes:  Sclera clear, no icterus.   Conjunctiva pale Ears:  Normal auditory acuity. Nose:  No deformity, discharge,  or lesions. Mouth:  No deformity or lesions.  Oropharynx pale and dry Neck:  Supple; no masses or thyromegaly. Lungs:  No visible respiratory distress Abdomen:  Soft, nontender and nondistended. No masses, hepatosplenomegaly or hernias noted. No guarding, and without rebound.     Msk:  Symmetrical without gross deformities. Normal posture. Pulses:  Normal pulses noted. Extremities:  Without clubbing or edema. Neurologic:  Alert and  oriented x4;  grossly normal neurologically. Skin:  Intact without significant lesions or rashes. Psych:  Alert and cooperative. Normal mood and affect.   Lab Results: Recent Labs    05/22/24 1456 05/23/24 0436  WBC 7.1 7.9  HGB 9.3* 8.9*  HCT 32.0* 31.3*  PLT 255 260   BMET Recent Labs    05/22/24 1456 05/23/24 0436  NA 137 134*  K 3.1* 3.0*  CL 102 102  CO2 27 28  GLUCOSE 108* 95  BUN 7 5*  CREATININE 0.70 0.81  CALCIUM 8.7* 8.6*   LFT Recent Labs    05/22/24 1456  PROT 7.5  ALBUMIN 3.7  AST 16  ALT 13  ALKPHOS 59  BILITOT 0.5   PT/INR Recent Labs    05/22/24 1916  LABPROT 14.3  INR 1.1    Studies/Results: CT ANGIO GI BLEED Result Date: 05/22/2024 CLINICAL DATA:  Provided history: Lower GI bleed. Technologist notes state recent gastric ulcer, repaired 2 weeks ago, not taking prescribed medication. Cough for ground vomiting  and dark stool. EXAM: CTA ABDOMEN AND PELVIS WITHOUT AND WITH CONTRAST TECHNIQUE: Multidetector CT imaging of the abdomen and pelvis was performed using the standard protocol during bolus administration of intravenous contrast. Multiplanar reconstructed images and MIPs were obtained and reviewed to evaluate the vascular anatomy. RADIATION DOSE REDUCTION: This exam was performed according to the departmental dose-optimization program which includes automated exposure control, adjustment of the mA and/or kV according to patient size and/or use of iterative reconstruction technique. CONTRAST:  OMNIPAQUE  IOHEXOL  350 MG/ML SOLN COMPARISON:  Abdominopelvic CT 04/24/2024 FINDINGS: VASCULAR Aorta: Normal caliber aorta without aneurysm, dissection, vasculitis or significant stenosis. Minimal aortic atherosclerosis.  Celiac: Patent without evidence of aneurysm, dissection, vasculitis or significant stenosis. SMA: Patent without evidence of aneurysm, dissection, vasculitis or significant stenosis. Renals: 2 right and single left renal arteries. All renal arteries are patent without evidence of aneurysm, dissection, vasculitis, fibromuscular dysplasia or significant stenosis. IMA: Patent without evidence of aneurysm, dissection, vasculitis or significant stenosis. Inflow: Patent without evidence of aneurysm, dissection, vasculitis or significant stenosis. Proximal Outflow: Bilateral common femoral and visualized portions of the superficial and profunda femoral arteries are patent without evidence of aneurysm, dissection, vasculitis or significant stenosis. Veins: Venous phase imaging demonstrates patency of the portal, splenic, and mesenteric veins. The iliac veins and IVC are patent. Review of the MIP images confirms the above findings. NON-VASCULAR Lower chest: No basilar airspace disease or pleural effusion. Hepatobiliary: No focal liver abnormality is seen. No gallstones, gallbladder wall thickening, or biliary  dilatation. Pancreas: No ductal dilatation or inflammation. Spleen: Normal in size without focal abnormality. Adrenals/Urinary Tract: No adrenal nodule. No hydronephrosis or renal calculi. Unremarkable urinary bladder. Stomach/Bowel: No contrast accumulation in the GI tract to localize site of GI bleed. Gastric mucosal enhancement with questionable outpouching involving the distal stomach series 18, image 25 concerning for gastric ulcer. Mild edema of the adjacent fat but no discrete perforation. Free air. No small bowel distension or obstruction. Small to moderate colonic stool burden. Normal appendix. Lymphatic: Few prominent upper abdominal nodes including a 6 mm mesenteric node series 18, image 28. 7 mm perigastric node series 18, image 27. Reproductive: Prostate is unremarkable. Other: Small amount of simple free fluid in the dependent pelvis. No free intra-abdominal air. Musculoskeletal: There are no acute or suspicious osseous abnormalities. IMPRESSION: 1. No contrast accumulation in the GI tract to localize site of GI bleed. 2. Gastric mucosal enhancement with questionable outpouching involving the distal stomach concerning for gastric ulcer. Mild edema of the adjacent fat but no discrete perforation. 3. Few prominent upper abdominal nodes, likely reactive. 4. Small amount of simple free fluid in the dependent pelvis. Aortic Atherosclerosis (ICD10-I70.0). Electronically Signed   By: Andrea Gasman M.D.   On: 05/22/2024 21:07    Impression:   Coffee ground emesis and coffee grounds in stool.  Symptoms recurred soon after stopping pantoprazole . Known gastric ulcer, recent endoscopy with pyloric channel ulcer with clot. Acute blood loss anemia.  Plan:   I feel patient's symptoms most likely from cessation of PPI.  I do not detect rampant ongoing bleeding at this time.  Hgb now about same as was when he was discharged last month. Advise PPI bid and sucralfate  suspension. CBCs; transfuse as  needed. No ASA/NSAIDs. H. Pylori stool antigen. Clear liquid diet ok. If Hgb drop or recurrent persistent active bleeding despite PPI/sucralfate , would consider repeat EGD. [facility] will follow.   LOS: 1 day   Sherald Balbuena M  05/23/2024, 1:53 PM  Cell [phone] If no answer or after 5 PM call [phone]

## 2024-05-24 LAB — BASIC METABOLIC PANEL WITH GFR
Anion gap: 6 (ref 5–15)
BUN: 6 mg/dL (ref 6–20)
CO2: 27 mmol/L (ref 22–32)
Calcium: 8.6 mg/dL — ABNORMAL LOW (ref 8.9–10.3)
Chloride: 102 mmol/L (ref 98–111)
Creatinine, Ser: 0.83 mg/dL (ref 0.61–1.24)
GFR, Estimated: >60 mL/min (ref >60)
Glucose, Bld: 97 mg/dL (ref 70–99)
Potassium: 3.6 mmol/L (ref 3.5–5.1)
Sodium: 135 mmol/L (ref 135–145)

## 2024-05-24 LAB — CBC
HCT: 32 % — ABNORMAL LOW (ref 39.0–52.0)
Hemoglobin: 9.1 g/dL — ABNORMAL LOW (ref 13.0–17.0)
MCH: 20.5 pg — ABNORMAL LOW (ref 26.0–34.0)
MCHC: 28.4 g/dL — ABNORMAL LOW (ref 30.0–36.0)
MCV: 72.2 fL — ABNORMAL LOW (ref 80.0–100.0)
Platelets: 256 K/uL (ref 150–400)
RBC: 4.43 MIL/uL (ref 4.22–5.81)
RDW: 20.2 % — ABNORMAL HIGH (ref 11.5–15.5)
WBC: 7.8 K/uL (ref 4.0–10.5)
nRBC: 0 % (ref 0.0–0.2)

## 2024-05-24 NOTE — Plan of Care (Signed)

## 2024-05-24 NOTE — Progress Notes (Signed)
 PROGRESS NOTE    Joshua Gutierrez  FMW:[id number] DOB: [birth year] DOA: 05/22/2024 PCP: Patient, No Pcp Per   Brief Narrative:  Joshua Gutierrez is a 38 y.o. male with medical history significant for recently diagnosed gastritis and gastric ulcer on EGD now presenting to the ED with epigastric pain, coffee-ground emesis and stool.    Assessment & Plan:   Principal Problem:   GI bleed Active Problems:   Epigastric pain   Acute gastric ulcer without hemorrhage or perforation   Hypokalemia  Rule out acute GI bleed Chronic iron deficiency anemia  - Hgb of 9.3 on admission from baseline of 11-12 - Pt presented with coffee-ground emesis and stool - Recent EGD showed gastric ulcer and gastritis - Abdominal CT shows findings concerning for gastric ulcer but no discrete perforation - [facility] following, appreciate insight recommendations, no indication for intervention or endoscopy at this time - continue PPI/Carafate  - Advance diet as tolerated  Gastritis Epigastric pain Hx of gastric ulcer - Presented with 3 days of progressive epigastric pain likely secondary to gastric ulcer - Continue IV Protonix    Hypokalemia - Replete - recheck in am  DVT prophylaxis: SCDs Start: 05/22/24 2157 Code Status:   Code Status: Full Code Family Communication: None present  Status is: Inpt  Dispo: The patient is from: Home              Anticipated d/c is to: Home              Anticipated d/c date is: 24 to 48 hours              Patient currently not medically stable for discharge  Consultants:  [facility], [facility]  Procedures:  None  Antimicrobials:  None  Subjective: No acute issues or events overnight  Objective: Vitals:   05/23/24 0407 05/23/24 1345 05/23/24 2024 05/24/24 0418  BP: 111/68 113/82 103/80 93/65  Pulse: 76 65 79 72  Resp: 16 17 16 18   Temp: 97.8 F (36.6 C) 97.8 F (36.6 C) 97.7 F (36.5 C) 98 F (36.7 C)  TempSrc:      SpO2: 100% 100% 100% 100%  Weight:      Height:         Intake/Output Summary (Last 24 hours) at 05/24/2024 0803 Last data filed at 05/23/2024 1951 Gross per 24 hour  Intake 2130 ml  Output --  Net 2130 ml   Filed Weights   05/22/24 1454  Weight: 63.5 kg    Examination:  General:  Pleasantly resting in bed, No acute distress. HEENT:  Normocephalic atraumatic.  Sclerae nonicteric, noninjected.  Extraocular movements intact bilaterally. Neck:  Without mass or deformity.  Trachea is midline. Lungs:  Clear to auscultate bilaterally without rhonchi, wheeze, or rales. Heart:  Regular rate and rhythm.  Without murmurs, rubs, or gallops. Abdomen:  Soft, nontender, nondistended.  Without guarding or rebound. Extremities: Without cyanosis, clubbing, edema, or obvious deformity. Skin:  Warm and dry, no erythema.  Data Reviewed: I have personally reviewed following labs and imaging studies  CBC: Recent Labs  Lab 05/22/24 1456 05/23/24 0436 05/24/24 0412  WBC 7.1 7.9 7.8  NEUTROABS 2.8  --   --   HGB 9.3* 8.9* 9.1*  HCT 32.0* 31.3* 32.0*  MCV 72.9* 72.8* 72.2*  PLT 255 260 256   Basic Metabolic Panel: Recent Labs  Lab 05/22/24 1456 05/23/24 0436 05/24/24 0412  NA 137 134* 135  K 3.1* 3.0* 3.6  CL 102 102 102  CO2 27  28 27  GLUCOSE 108* 95 97  BUN 7 5* 6  CREATININE 0.70 0.81 0.83  CALCIUM 8.7* 8.6* 8.6*  MG  --  2.0  --    GFR: Estimated Creatinine Clearance: 109.4 mL/min (by C-G formula based on SCr of 0.83 mg/dL). Liver Function Tests: Recent Labs  Lab 05/22/24 1456  AST 16  ALT 13  ALKPHOS 59  BILITOT 0.5  PROT 7.5  ALBUMIN 3.7   Recent Labs  Lab 05/22/24 1456  LIPASE 29    Coagulation Profile: Recent Labs  Lab 05/22/24 1916  INR 1.1   Anemia Panel: Recent Labs    05/23/24 0436  VITAMINB12 206  FERRITIN 5*  TIBC 475*  IRON 18*    No results found for this or any previous visit (from the past 240 hours).   Radiology Studies: CT ANGIO GI BLEED Result Date: 05/22/2024 CLINICAL DATA:   Provided history: Lower GI bleed. Technologist notes state recent gastric ulcer, repaired 2 weeks ago, not taking prescribed medication. Cough for ground vomiting and dark stool. EXAM: CTA ABDOMEN AND PELVIS WITHOUT AND WITH CONTRAST TECHNIQUE: Multidetector CT imaging of the abdomen and pelvis was performed using the standard protocol during bolus administration of intravenous contrast. Multiplanar reconstructed images and MIPs were obtained and reviewed to evaluate the vascular anatomy. RADIATION DOSE REDUCTION: This exam was performed according to the departmental dose-optimization program which includes automated exposure control, adjustment of the mA and/or kV according to patient size and/or use of iterative reconstruction technique. CONTRAST:  OMNIPAQUE  IOHEXOL  350 MG/ML SOLN COMPARISON:  Abdominopelvic CT 04/24/2024 FINDINGS: VASCULAR Aorta: Normal caliber aorta without aneurysm, dissection, vasculitis or significant stenosis. Minimal aortic atherosclerosis. Celiac: Patent without evidence of aneurysm, dissection, vasculitis or significant stenosis. SMA: Patent without evidence of aneurysm, dissection, vasculitis or significant stenosis. Renals: 2 right and single left renal arteries. All renal arteries are patent without evidence of aneurysm, dissection, vasculitis, fibromuscular dysplasia or significant stenosis. IMA: Patent without evidence of aneurysm, dissection, vasculitis or significant stenosis. Inflow: Patent without evidence of aneurysm, dissection, vasculitis or significant stenosis. Proximal Outflow: Bilateral common femoral and visualized portions of the superficial and profunda femoral arteries are patent without evidence of aneurysm, dissection, vasculitis or significant stenosis. Veins: Venous phase imaging demonstrates patency of the portal, splenic, and mesenteric veins. The iliac veins and IVC are patent. Review of the MIP images confirms the above findings. NON-VASCULAR Lower chest:  No basilar airspace disease or pleural effusion. Hepatobiliary: No focal liver abnormality is seen. No gallstones, gallbladder wall thickening, or biliary dilatation. Pancreas: No ductal dilatation or inflammation. Spleen: Normal in size without focal abnormality. Adrenals/Urinary Tract: No adrenal nodule. No hydronephrosis or renal calculi. Unremarkable urinary bladder. Stomach/Bowel: No contrast accumulation in the GI tract to localize site of GI bleed. Gastric mucosal enhancement with questionable outpouching involving the distal stomach series 18, image 25 concerning for gastric ulcer. Mild edema of the adjacent fat but no discrete perforation. Free air. No small bowel distension or obstruction. Small to moderate colonic stool burden. Normal appendix. Lymphatic: Few prominent upper abdominal nodes including a 6 mm mesenteric node series 18, image 28. 7 mm perigastric node series 18, image 27. Reproductive: Prostate is unremarkable. Other: Small amount of simple free fluid in the dependent pelvis. No free intra-abdominal air. Musculoskeletal: There are no acute or suspicious osseous abnormalities. IMPRESSION: 1. No contrast accumulation in the GI tract to localize site of GI bleed. 2. Gastric mucosal enhancement with questionable outpouching involving the distal stomach concerning  for gastric ulcer. Mild edema of the adjacent fat but no discrete perforation. 3. Few prominent upper abdominal nodes, likely reactive. 4. Small amount of simple free fluid in the dependent pelvis. Aortic Atherosclerosis (ICD10-I70.0). Electronically Signed   By: Andrea Gasman M.D.   On: 05/22/2024 21:07    Scheduled Meds:  pantoprazole  (PROTONIX ) IV  40 mg Intravenous Q12H   sucralfate   1 g Oral TID WC & HS   Continuous Infusions:     LOS: 2 days   Time spent: 55 min  Elsie JAYSON Montclair, DO [facility]  If 7PM-7AM, please contact night-coverage [email or web address]  05/24/2024, 8:03 AM

## 2024-05-24 NOTE — Progress Notes (Signed)
 Subjective: No further black stools or coffee-ground emesis.  Tolerating full liquids without abdominal pain.  Objective: Vital signs in last 24 hours: Temp:  [97.7 F (36.5 C)-98 F (36.7 C)] 98 F (36.7 C) (07/25 0418) Pulse Rate:  [65-79] 72 (07/25 0418) Resp:  [16-18] 18 (07/25 0418) BP: (93-113)/(65-82) 93/65 (07/25 0418) SpO2:  [100 %] 100 % (07/25 0418) Weight change:     PE: Mild pallor GENERAL: Not in distress  ABDOMEN: Nondistended, nontender EXTREMITIES: No deformity  Lab Results: Results for orders placed or performed during the hospital encounter of 05/22/24 (from the past 48 hours)  Comprehensive metabolic panel     Status: Abnormal   Collection Time: 05/22/24  2:56 PM  Result Value Ref Range   Sodium 137 135 - 145 mmol/L   Potassium 3.1 (L) 3.5 - 5.1 mmol/L   Chloride 102 98 - 111 mmol/L   CO2 27 22 - 32 mmol/L   Glucose, Bld 108 (H) 70 - 99 mg/dL    Comment: Glucose reference range applies only to samples taken after fasting for at least 8 hours.   BUN 7 6 - 20 mg/dL   Creatinine, Ser 9.29 0.61 - 1.24 mg/dL   Calcium 8.7 (L) 8.9 - 10.3 mg/dL   Total Protein 7.5 6.5 - 8.1 g/dL   Albumin 3.7 3.5 - 5.0 g/dL   AST 16 15 - 41 U/L   ALT 13 0 - 44 U/L   Alkaline Phosphatase 59 38 - 126 U/L   Total Bilirubin 0.5 0.0 - 1.2 mg/dL   GFR, Estimated >39 >39 mL/min    Comment: (NOTE) Calculated using the CKD-EPI Creatinine Equation (2021)    Anion gap 8 5 - 15    Comment: Performed at [facility], [address], [place], [place] [address]  Lipase, blood     Status: None   Collection Time: 05/22/24  2:56 PM  Result Value Ref Range   Lipase 29 11 - 51 U/L    Comment: Performed at [facility], [address] [place], [place] [address]  CBC with Diff     Status: Abnormal   Collection Time: 05/22/24  2:56 PM  Result Value Ref Range   WBC 7.1 4.0 - 10.5 K/uL   RBC 4.39 4.22 - 5.81 MIL/uL   Hemoglobin 9.3 (L) 13.0 - 17.0  g/dL   HCT 67.9 (L) 60.9 - 47.9 %   MCV 72.9 (L) 80.0 - 100.0 fL   MCH 21.2 (L) 26.0 - 34.0 pg   MCHC 29.1 (L) 30.0 - 36.0 g/dL   RDW 79.5 (H) 88.4 - 84.4 %   Platelets 255 150 - 400 K/uL   nRBC 0.0 0.0 - 0.2 %   Neutrophils Relative % 41 %   Neutro Abs 2.8 1.7 - 7.7 K/uL   Lymphocytes Relative 23 %   Lymphs Abs 1.7 0.7 - 4.0 K/uL   Monocytes Relative 9 %   Monocytes Absolute 0.6 0.1 - 1.0 K/uL   Eosinophils Relative 26 %   Eosinophils Absolute 1.9 (H) 0.0 - 0.5 K/uL   Basophils Relative 1 %   Basophils Absolute 0.1 0.0 - 0.1 K/uL   Immature Granulocytes 0 %   Abs Immature Granulocytes 0.02 0.00 - 0.07 K/uL    Comment: Performed at [facility], [address] [place], [place] [address]  Type and screen [facility]     Status: None   Collection Time: 05/22/24  7:05 PM  Result Value Ref Range   ABO/RH(D) O POS    Antibody Screen NEG    Sample Expiration      05/25/2024,2359 Performed at [facility], [address] [place], [place] [address]   Protime-INR     Status: None   Collection Time: 05/22/24  7:16 PM  Result Value Ref Range   Prothrombin Time 14.3 11.4 - 15.2 seconds   INR 1.1 0.8 - 1.2    Comment: (NOTE) INR goal varies based on device and disease states. Performed at [facility], [address], [place], [place] [address]   Urinalysis, Routine w reflex microscopic -Urine, Clean Catch     Status: Abnormal   Collection Time: 05/22/24  9:03 PM  Result Value Ref Range   Color, Urine YELLOW YELLOW   APPearance HAZY (A) CLEAR   Specific Gravity, Urine 1.011 1.005 - 1.030   pH 8.0 5.0 - 8.0   Glucose, UA NEGATIVE NEGATIVE mg/dL   Hgb urine dipstick NEGATIVE NEGATIVE   Bilirubin Urine NEGATIVE NEGATIVE   Ketones, ur NEGATIVE NEGATIVE mg/dL   Protein, ur NEGATIVE NEGATIVE mg/dL   Nitrite NEGATIVE NEGATIVE   Leukocytes,Ua NEGATIVE NEGATIVE    Comment: Performed at [facility], [address], [place], [place] [address]  Magnesium     Status: None   Collection Time: 05/23/24  4:36 AM  Result Value Ref Range   Magnesium 2.0 1.7 - 2.4 mg/dL    Comment: Performed at [facility], [address], [place], [place] [address]  Basic metabolic panel with GFR     Status: Abnormal   Collection Time: 05/23/24  4:36 AM  Result Value Ref Range   Sodium 134 (L) 135 - 145 mmol/L   Potassium 3.0 (L) 3.5 - 5.1 mmol/L   Chloride 102 98 - 111 mmol/L   CO2 28 22 - 32 mmol/L   Glucose, Bld 95 70 - 99 mg/dL    Comment: Glucose reference range applies only to samples taken after fasting for at least 8 hours.   BUN 5 (L) 6 - 20 mg/dL   Creatinine, Ser 9.18 0.61 - 1.24 mg/dL   Calcium 8.6 (L) 8.9 - 10.3 mg/dL   GFR, Estimated >39 >39 mL/min    Comment: (NOTE) Calculated using the CKD-EPI Creatinine Equation (2021)    Anion gap 4 (L) 5 - 15    Comment: Performed at [facility], [address] [place], [place] [address]  CBC     Status: Abnormal   Collection Time: 05/23/24  4:36 AM  Result Value Ref Range   WBC 7.9 4.0 - 10.5 K/uL   RBC 4.30 4.22 - 5.81 MIL/uL   Hemoglobin 8.9 (L) 13.0 - 17.0 g/dL    Comment: Reticulocyte Hemoglobin testing may be clinically indicated, consider ordering this additional test [id number]    HCT 31.3 (L) 39.0 - 52.0 %   MCV 72.8 (L) 80.0 - 100.0 fL   MCH 20.7 (L) 26.0 - 34.0 pg   MCHC 28.4 (L) 30.0 - 36.0 g/dL   RDW 79.6 (H) 88.4 - 84.4 %   Platelets 260 150 - 400 K/uL   nRBC 0.0 0.0 - 0.2 %    Comment: Performed at [facility], [address] [place], [place] [address]  Iron and TIBC     Status: Abnormal   Collection Time: 05/23/24  4:36 AM  Result Value Ref Range   Iron 18 (L) 45 -  182 ug/dL   TIBC 524 (H) 749 - 549 ug/dL   Saturation Ratios 4 (L) 17.9 - 39.5 %   UIBC 457 ug/dL    Comment: Performed at [facility], [address]  [place], [place] [address]  Ferritin     Status: Abnormal   Collection Time: 05/23/24  4:36 AM  Result Value Ref Range   Ferritin 5 (L) 24 - 336 ng/mL    Comment: Performed at [facility], [address] [place], [place] [address]  Vitamin B12     Status: None   Collection Time: 05/23/24  4:36 AM  Result Value Ref Range   Vitamin B-12 206 180 - 914 pg/mL    Comment: (NOTE) This assay is not validated for testing neonatal or myeloproliferative syndrome specimens for Vitamin B12 levels. Performed at [facility], [address] [place], [place] [address]   CBC     Status: Abnormal   Collection Time: 05/24/24  4:12 AM  Result Value Ref Range   WBC 7.8 4.0 - 10.5 K/uL   RBC 4.43 4.22 - 5.81 MIL/uL   Hemoglobin 9.1 (L) 13.0 - 17.0 g/dL   HCT 67.9 (L) 60.9 - 47.9 %   MCV 72.2 (L) 80.0 - 100.0 fL   MCH 20.5 (L) 26.0 - 34.0 pg   MCHC 28.4 (L) 30.0 - 36.0 g/dL   RDW 79.7 (H) 88.4 - 84.4 %   Platelets 256 150 - 400 K/uL   nRBC 0.0 0.0 - 0.2 %    Comment: Performed at [facility], [address], [place], [place] [address]  Basic metabolic panel with GFR     Status: Abnormal   Collection Time: 05/24/24  4:12 AM  Result Value Ref Range   Sodium 135 135 - 145 mmol/L   Potassium 3.6 3.5 - 5.1 mmol/L   Chloride 102 98 - 111 mmol/L   CO2 27 22 - 32 mmol/L   Glucose, Bld 97 70 - 99 mg/dL    Comment: Glucose reference range applies only to samples taken after fasting for at least 8 hours.   BUN 6 6 - 20 mg/dL   Creatinine, Ser 9.16 0.61 - 1.24 mg/dL   Calcium 8.6 (L) 8.9 - 10.3 mg/dL   GFR, Estimated >39 >39 mL/min    Comment: (NOTE) Calculated using the CKD-EPI Creatinine Equation (2021)    Anion gap 6 5 - 15    Comment: Performed at [facility], [address], [place], [place] [address]    Studies/Results: CT ANGIO GI BLEED Result Date: 05/22/2024 CLINICAL DATA:  Provided history: Lower GI bleed.  Technologist notes state recent gastric ulcer, repaired 2 weeks ago, not taking prescribed medication. Cough for ground vomiting and dark stool. EXAM: CTA ABDOMEN AND PELVIS WITHOUT AND WITH CONTRAST TECHNIQUE: Multidetector CT imaging of the abdomen and pelvis was performed using the standard protocol during bolus administration of intravenous contrast. Multiplanar reconstructed images and MIPs were obtained and reviewed to evaluate the vascular anatomy. RADIATION DOSE REDUCTION: This exam was performed according to the departmental dose-optimization program which includes automated exposure control, adjustment of the mA and/or kV according to patient size and/or use of iterative reconstruction technique. CONTRAST:  OMNIPAQUE  IOHEXOL  350 MG/ML SOLN COMPARISON:  Abdominopelvic CT 04/24/2024 FINDINGS: VASCULAR Aorta: Normal caliber aorta without aneurysm, dissection, vasculitis or significant stenosis. Minimal aortic atherosclerosis. Celiac: Patent without evidence of aneurysm, dissection, vasculitis or significant stenosis. SMA: Patent without evidence  of aneurysm, dissection, vasculitis or significant stenosis. Renals: 2 right and single left renal arteries. All renal arteries are patent without evidence of aneurysm, dissection, vasculitis, fibromuscular dysplasia or significant stenosis. IMA: Patent without evidence of aneurysm, dissection, vasculitis or significant stenosis. Inflow: Patent without evidence of aneurysm, dissection, vasculitis or significant stenosis. Proximal Outflow: Bilateral common femoral and visualized portions of the superficial and profunda femoral arteries are patent without evidence of aneurysm, dissection, vasculitis or significant stenosis. Veins: Venous phase imaging demonstrates patency of the portal, splenic, and mesenteric veins. The iliac veins and IVC are patent. Review of the MIP images confirms the above findings. NON-VASCULAR Lower chest: No basilar airspace disease or  pleural effusion. Hepatobiliary: No focal liver abnormality is seen. No gallstones, gallbladder wall thickening, or biliary dilatation. Pancreas: No ductal dilatation or inflammation. Spleen: Normal in size without focal abnormality. Adrenals/Urinary Tract: No adrenal nodule. No hydronephrosis or renal calculi. Unremarkable urinary bladder. Stomach/Bowel: No contrast accumulation in the GI tract to localize site of GI bleed. Gastric mucosal enhancement with questionable outpouching involving the distal stomach series 18, image 25 concerning for gastric ulcer. Mild edema of the adjacent fat but no discrete perforation. Free air. No small bowel distension or obstruction. Small to moderate colonic stool burden. Normal appendix. Lymphatic: Few prominent upper abdominal nodes including a 6 mm mesenteric node series 18, image 28. 7 mm perigastric node series 18, image 27. Reproductive: Prostate is unremarkable. Other: Small amount of simple free fluid in the dependent pelvis. No free intra-abdominal air. Musculoskeletal: There are no acute or suspicious osseous abnormalities. IMPRESSION: 1. No contrast accumulation in the GI tract to localize site of GI bleed. 2. Gastric mucosal enhancement with questionable outpouching involving the distal stomach concerning for gastric ulcer. Mild edema of the adjacent fat but no discrete perforation. 3. Few prominent upper abdominal nodes, likely reactive. 4. Small amount of simple free fluid in the dependent pelvis. Aortic Atherosclerosis (ICD10-I70.0). Electronically Signed   By: Andrea Gasman M.D.   On: 05/22/2024 21:07    Medications: I have reviewed the patient's current medications.  Assessment: History of pyloric ulcer treated with Hemospray in 03/2024 Recurrence of melena and coffee-ground emesis after stopped using PPI Hemoglobin stable 9.3/8.9/9.1, has not required transfusion BUN unremarkable CT shows gastric mucosal enhancement gastric ulcer mild edema of  adjacent fat but no discrete perforation with reactive lymph nodes Iron deficiency, ferritin 5, saturation 4%, TIBC elevated 475   Plan: Continue medical management H. pylori stool antigen pending Continue pantoprazole  40 mg every 12 hours, needs to be continued as an outpatient as well for total of 2 months at least. Continue sucralfate  1 g suspension 4 times a day, needs to be continued for at least 2 weeks as an outpatient. Will discharge in a.m.SABRA  Estelita Manas, MD 05/24/2024, 11:47 AM

## 2024-05-25 ENCOUNTER — Other Ambulatory Visit (HOSPITAL_COMMUNITY): Payer: Self-pay

## 2024-05-25 LAB — CBC
HCT: 31.2 % — ABNORMAL LOW (ref 39.0–52.0)
Hemoglobin: 9.2 g/dL — ABNORMAL LOW (ref 13.0–17.0)
MCH: 21.3 pg — ABNORMAL LOW (ref 26.0–34.0)
MCHC: 29.5 g/dL — ABNORMAL LOW (ref 30.0–36.0)
MCV: 72.2 fL — ABNORMAL LOW (ref 80.0–100.0)
Platelets: 263 K/uL (ref 150–400)
RBC: 4.32 MIL/uL (ref 4.22–5.81)
RDW: 20.1 % — ABNORMAL HIGH (ref 11.5–15.5)
WBC: 6.5 K/uL (ref 4.0–10.5)
nRBC: 0 % (ref 0.0–0.2)

## 2024-05-25 LAB — BASIC METABOLIC PANEL WITH GFR
Anion gap: 7 (ref 5–15)
BUN: 14 mg/dL (ref 6–20)
CO2: 29 mmol/L (ref 22–32)
Calcium: 9 mg/dL (ref 8.9–10.3)
Chloride: 102 mmol/L (ref 98–111)
Creatinine, Ser: 0.88 mg/dL (ref 0.61–1.24)
GFR, Estimated: >60 mL/min (ref >60)
Glucose, Bld: 101 mg/dL — ABNORMAL HIGH (ref 70–99)
Potassium: 3.8 mmol/L (ref 3.5–5.1)
Sodium: 138 mmol/L (ref 135–145)

## 2024-05-25 MED ORDER — PANTOPRAZOLE SODIUM 40 MG PO TBEC
40.0000 mg | DELAYED_RELEASE_TABLET | Freq: Two times a day (BID) | ORAL | 0 refills | Status: AC
  Filled 2024-05-25: qty 180, 90d supply, fill #0

## 2024-05-25 MED ORDER — SUCRALFATE 1 G PO TABS
1.0000 g | ORAL_TABLET | Freq: Three times a day (TID) | ORAL | 0 refills | Status: AC
  Filled 2024-05-25: qty 420, 11d supply, fill #0
  Filled 2024-05-25: qty 42, 11d supply, fill #0

## 2024-05-25 NOTE — Plan of Care (Signed)
   Problem: Clinical Measurements: Goal: Diagnostic test results will improve Outcome: Progressing   Problem: Activity: Goal: Risk for activity intolerance will decrease Outcome: Progressing

## 2024-05-25 NOTE — Discharge Summary (Signed)
 Physician Discharge Summary  Joshua Gutierrez FMW:[id number] DOB: 1986/04/06 DOA: 05/22/2024  PCP: Patient, No Pcp Per  Admit date: 05/22/2024 Discharge date: 05/25/2024  Admitted From: Home Disposition: Home  Recommendations for Outpatient Follow-up:  Follow up with PCP in 1-2 weeks Up with GI as scheduled  Home Health: None Equipment/Devices: None  Discharge Condition: Stable CODE STATUS: Full Diet recommendation: Low-salt low-fat diet  Brief/Interim Summary: Joshua Gutierrez is a 38 y.o. male with medical history significant for recently diagnosed gastritis and gastric ulcer on EGD now presenting to the ED with epigastric pain, coffee-ground emesis and stool.  Patient presents to our facility after recent EGD showing gastritis and ulcer with worsening coffee-ground emesis and dark stool.  It appears patient has been noncompliant with his PPI which was restarted at intake.  Discussed case with GI, no indication for intervention or imaging at this time given patient's stable hemoglobin and no further episodes or events while on his PPI.  New prescription for PPI at discharge, educated patient on importance of continuing his current regimen and risks of noncompliance.  Discharge Diagnoses:  Principal Problem:   GI bleed Active Problems:   Epigastric pain   Acute gastric ulcer without hemorrhage or perforation   Hypokalemia  Presumed acute GI bleed Chronic iron deficiency anemia  - Hgb of 9.3 on admission from baseline of 11-12 - Pt presented with coffee-ground emesis and stool after stopping PPI - Recent EGD showed gastric ulcer and gastritis - Abdominal CT shows findings concerning for gastric ulcer but no discrete perforation - [facility] following, no indication for intervention or endoscopy at this time - continue PPI/Carafate  - Advance diet as tolerated   Gastritis Epigastric pain Hx of gastric ulcer - Presented with 3 days of progressive epigastric pain likely secondary to gastric  ulcer - Continue PPI/Carafate  as above   Hypokalemia - Improving with p.o. intake Discharge Instructions  Discharge Instructions     Call MD for:  difficulty breathing, headache or visual disturbances   Complete by: As directed    Call MD for:  extreme fatigue   Complete by: As directed    Call MD for:  hives   Complete by: As directed    Call MD for:  persistant dizziness or light-headedness   Complete by: As directed    Call MD for:  persistant nausea and vomiting   Complete by: As directed    Call MD for:  severe uncontrolled pain   Complete by: As directed    Call MD for:  temperature >100.4   Complete by: As directed    Diet - low sodium heart healthy   Complete by: As directed    Increase activity slowly   Complete by: As directed       Allergies as of 05/25/2024       Reactions   Ambien [zolpidem] Other (See Comments)   Seizures and Dizziness (Patient did not acknowledge this in 2025, however)        Medication List     TAKE these medications    pantoprazole  40 MG tablet Commonly known as: Protonix  Take 1 tablet (40 mg total) by mouth 2 (two) times daily before a meal. What changed: when to take this   sucralfate  1 g tablet Commonly known as: CARAFATE  Take 1 tablet (1 g total) by mouth 4 (four) times daily -  with meals and at bedtime.        Allergies  Allergen Reactions   Ambien [Zolpidem] Other (See Comments)  Seizures and Dizziness (Patient did not acknowledge this in 2025, however)    Consultations: [facility], [facility]  Procedures/Studies: CT ANGIO GI BLEED Result Date: 05/22/2024 CLINICAL DATA:  Provided history: Lower GI bleed. Technologist notes state recent gastric ulcer, repaired 2 weeks ago, not taking prescribed medication. Cough for ground vomiting and dark stool. EXAM: CTA ABDOMEN AND PELVIS WITHOUT AND WITH CONTRAST TECHNIQUE: Multidetector CT imaging of the abdomen and pelvis was performed using the standard protocol during bolus  administration of intravenous contrast. Multiplanar reconstructed images and MIPs were obtained and reviewed to evaluate the vascular anatomy. RADIATION DOSE REDUCTION: This exam was performed according to the departmental dose-optimization program which includes automated exposure control, adjustment of the mA and/or kV according to patient size and/or use of iterative reconstruction technique. CONTRAST:  OMNIPAQUE  IOHEXOL  350 MG/ML SOLN COMPARISON:  Abdominopelvic CT 04/24/2024 FINDINGS: VASCULAR Aorta: Normal caliber aorta without aneurysm, dissection, vasculitis or significant stenosis. Minimal aortic atherosclerosis. Celiac: Patent without evidence of aneurysm, dissection, vasculitis or significant stenosis. SMA: Patent without evidence of aneurysm, dissection, vasculitis or significant stenosis. Renals: 2 right and single left renal arteries. All renal arteries are patent without evidence of aneurysm, dissection, vasculitis, fibromuscular dysplasia or significant stenosis. IMA: Patent without evidence of aneurysm, dissection, vasculitis or significant stenosis. Inflow: Patent without evidence of aneurysm, dissection, vasculitis or significant stenosis. Proximal Outflow: Bilateral common femoral and visualized portions of the superficial and profunda femoral arteries are patent without evidence of aneurysm, dissection, vasculitis or significant stenosis. Veins: Venous phase imaging demonstrates patency of the portal, splenic, and mesenteric veins. The iliac veins and IVC are patent. Review of the MIP images confirms the above findings. NON-VASCULAR Lower chest: No basilar airspace disease or pleural effusion. Hepatobiliary: No focal liver abnormality is seen. No gallstones, gallbladder wall thickening, or biliary dilatation. Pancreas: No ductal dilatation or inflammation. Spleen: Normal in size without focal abnormality. Adrenals/Urinary Tract: No adrenal nodule. No hydronephrosis or renal calculi.  Unremarkable urinary bladder. Stomach/Bowel: No contrast accumulation in the GI tract to localize site of GI bleed. Gastric mucosal enhancement with questionable outpouching involving the distal stomach series 18, image 25 concerning for gastric ulcer. Mild edema of the adjacent fat but no discrete perforation. Free air. No small bowel distension or obstruction. Small to moderate colonic stool burden. Normal appendix. Lymphatic: Few prominent upper abdominal nodes including a 6 mm mesenteric node series 18, image 28. 7 mm perigastric node series 18, image 27. Reproductive: Prostate is unremarkable. Other: Small amount of simple free fluid in the dependent pelvis. No free intra-abdominal air. Musculoskeletal: There are no acute or suspicious osseous abnormalities. IMPRESSION: 1. No contrast accumulation in the GI tract to localize site of GI bleed. 2. Gastric mucosal enhancement with questionable outpouching involving the distal stomach concerning for gastric ulcer. Mild edema of the adjacent fat but no discrete perforation. 3. Few prominent upper abdominal nodes, likely reactive. 4. Small amount of simple free fluid in the dependent pelvis. Aortic Atherosclerosis (ICD10-I70.0). Electronically Signed   By: Andrea Gasman M.D.   On: 05/22/2024 21:07     Subjective: No acute issues or events overnight   Discharge Exam: Vitals:   05/24/24 2018 05/25/24 0608  BP: 106/67 108/66  Pulse: 95 65  Resp: 18 18  Temp: 98 F (36.7 C) 97.7 F (36.5 C)  SpO2: 100% 100%   Vitals:   05/24/24 1257 05/24/24 1700 05/24/24 2018 05/25/24 0608  BP: 100/70 117/72 106/67 108/66  Pulse:  97 95 65  Resp:  18 18 18   Temp:   98 F (36.7 C) 97.7 F (36.5 C)  TempSrc:      SpO2:  100% 100% 100%  Weight:      Height:        General: Pt is alert, awake, not in acute distress Cardiovascular: RRR, S1/S2 +, no rubs, no gallops Respiratory: CTA bilaterally, no wheezing, no rhonchi Abdominal: Soft, NT, ND, bowel  sounds + Extremities: no edema, no cyanosis    The results of significant diagnostics from this hospitalization (including imaging, microbiology, ancillary and laboratory) are listed below for reference.     Microbiology: No results found for this or any previous visit (from the past 240 hours).   Labs: BNP (last 3 results) No results for input(s): BNP in the last 8760 hours. Basic Metabolic Panel: Recent Labs  Lab 05/22/24 1456 05/23/24 0436 05/24/24 0412 05/25/24 0424  NA 137 134* 135 138  K 3.1* 3.0* 3.6 3.8  CL 102 102 102 102  CO2 27 28 27 29   GLUCOSE 108* 95 97 101*  BUN 7 5* 6 14  CREATININE 0.70 0.81 0.83 0.88  CALCIUM 8.7* 8.6* 8.6* 9.0  MG  --  2.0  --   --    Liver Function Tests: Recent Labs  Lab 05/22/24 1456  AST 16  ALT 13  ALKPHOS 59  BILITOT 0.5  PROT 7.5  ALBUMIN 3.7   Recent Labs  Lab 05/22/24 1456  LIPASE 29   No results for input(s): AMMONIA in the last 168 hours. CBC: Recent Labs  Lab 05/22/24 1456 05/23/24 0436 05/24/24 0412 05/25/24 0424  WBC 7.1 7.9 7.8 6.5  NEUTROABS 2.8  --   --   --   HGB 9.3* 8.9* 9.1* 9.2*  HCT 32.0* 31.3* 32.0* 31.2*  MCV 72.9* 72.8* 72.2* 72.2*  PLT 255 260 256 263   Cardiac Enzymes: No results for input(s): CKTOTAL, CKMB, CKMBINDEX, TROPONINI in the last 168 hours. BNP: Invalid input(s): POCBNP CBG: No results for input(s): GLUCAP in the last 168 hours. D-Dimer No results for input(s): DDIMER in the last 72 hours. Hgb A1c No results for input(s): HGBA1C in the last 72 hours. Lipid Profile No results for input(s): CHOL, HDL, LDLCALC, TRIG, CHOLHDL, LDLDIRECT in the last 72 hours. Thyroid function studies No results for input(s): TSH, T4TOTAL, T3FREE, THYROIDAB in the last 72 hours.  Invalid input(s): FREET3 Anemia work up Recent Labs    05/23/24 0436  VITAMINB12 206  FERRITIN 5*  TIBC 475*  IRON 18*   Urinalysis    Component Value  Date/Time   COLORURINE YELLOW 05/22/2024 2103   APPEARANCEUR HAZY (A) 05/22/2024 2103   LABSPEC 1.011 05/22/2024 2103   PHURINE 8.0 05/22/2024 2103   GLUCOSEU NEGATIVE 05/22/2024 2103   HGBUR NEGATIVE 05/22/2024 2103   BILIRUBINUR NEGATIVE 05/22/2024 2103   KETONESUR NEGATIVE 05/22/2024 2103   PROTEINUR NEGATIVE 05/22/2024 2103   UROBILINOGEN 1.0 08/14/2009 0032   NITRITE NEGATIVE 05/22/2024 2103   LEUKOCYTESUR NEGATIVE 05/22/2024 2103   Sepsis Labs Recent Labs  Lab 05/22/24 1456 05/23/24 0436 05/24/24 0412 05/25/24 0424  WBC 7.1 7.9 7.8 6.5   Microbiology No results found for this or any previous visit (from the past 240 hours).   Time coordinating discharge: Over 30 minutes  SIGNED:   Elsie JAYSON [place], DO [facility] 05/25/2024, 2:17 PM Pager   If 7PM-7AM, please contact night-coverage [email or web address]

## 2024-06-04 ENCOUNTER — Other Ambulatory Visit (HOSPITAL_COMMUNITY): Payer: Self-pay

## 2024-07-05 ENCOUNTER — Ambulatory Visit: Payer: Self-pay | Admitting: Family Medicine

## 2024-10-03 ENCOUNTER — Emergency Department (HOSPITAL_COMMUNITY)
Admission: EM | Admit: 2024-10-03 | Discharge: 2024-10-03 | Disposition: A | Discharge status: Home / Self Care | Payer: Self-pay | Attending: Emergency Medicine | Admitting: Emergency Medicine

## 2024-10-03 ENCOUNTER — Emergency Department (HOSPITAL_COMMUNITY): Payer: Self-pay

## 2024-10-03 ENCOUNTER — Encounter (HOSPITAL_COMMUNITY): Payer: Self-pay

## 2024-10-03 ENCOUNTER — Other Ambulatory Visit: Payer: Self-pay

## 2024-10-03 DIAGNOSIS — K2900 Acute gastritis without bleeding: Secondary | ICD-10-CM | POA: Insufficient documentation

## 2024-10-03 LAB — CBC WITH DIFFERENTIAL/PLATELET
Abs Immature Granulocytes: 0.01 K/uL (ref 0.00–0.07)
Basophils Absolute: 0.1 K/uL (ref 0.0–0.1)
Basophils Relative: 1 %
Eosinophils Absolute: 0.2 K/uL (ref 0.0–0.5)
Eosinophils Relative: 2 %
HCT: 45.4 % (ref 39.0–52.0)
Hemoglobin: 13.2 g/dL (ref 13.0–17.0)
Immature Granulocytes: 0 %
Lymphocytes Relative: 17 %
Lymphs Abs: 1.4 K/uL (ref 0.7–4.0)
MCH: 19.4 pg — ABNORMAL LOW (ref 26.0–34.0)
MCHC: 29.1 g/dL — ABNORMAL LOW (ref 30.0–36.0)
MCV: 66.8 fL — ABNORMAL LOW (ref 80.0–100.0)
Monocytes Absolute: 1 K/uL (ref 0.1–1.0)
Monocytes Relative: 12 %
Neutro Abs: 5.5 K/uL (ref 1.7–7.7)
Neutrophils Relative %: 68 %
Platelets: 344 K/uL (ref 150–400)
RBC: 6.8 MIL/uL — ABNORMAL HIGH (ref 4.22–5.81)
RDW: 19.5 % — ABNORMAL HIGH (ref 11.5–15.5)
WBC: 8 K/uL (ref 4.0–10.5)
nRBC: 0 % (ref 0.0–0.2)

## 2024-10-03 LAB — COMPREHENSIVE METABOLIC PANEL WITH GFR
ALT: 18 U/L (ref 0–44)
AST: 25 U/L (ref 15–41)
Albumin: 5 g/dL (ref 3.5–5.0)
Alkaline Phosphatase: 90 U/L (ref 38–126)
Anion gap: 13 (ref 5–15)
BUN: 18 mg/dL (ref 6–20)
CO2: 35 mmol/L — ABNORMAL HIGH (ref 22–32)
Calcium: 10.3 mg/dL (ref 8.9–10.3)
Chloride: 93 mmol/L — ABNORMAL LOW (ref 98–111)
Creatinine, Ser: 1.17 mg/dL (ref 0.61–1.24)
GFR, Estimated: >60 mL/min (ref >60)
Glucose, Bld: 127 mg/dL — ABNORMAL HIGH (ref 70–99)
Potassium: 3.7 mmol/L (ref 3.5–5.1)
Sodium: 140 mmol/L (ref 135–145)
Total Bilirubin: 0.5 mg/dL (ref 0.0–1.2)
Total Protein: 9.5 g/dL — ABNORMAL HIGH (ref 6.5–8.1)

## 2024-10-03 LAB — LIPASE, BLOOD: Lipase: 22 U/L (ref 11–51)

## 2024-10-03 MED ORDER — FAMOTIDINE 20 MG PO TABS
20.0000 mg | ORAL_TABLET | Freq: Once | ORAL | Status: AC
  Administered 2024-10-03: 20 mg via ORAL
  Filled 2024-10-03: qty 1

## 2024-10-03 MED ORDER — ACETAMINOPHEN 500 MG PO TABS
1000.0000 mg | ORAL_TABLET | Freq: Once | ORAL | Status: AC
  Administered 2024-10-03: 1000 mg via ORAL
  Filled 2024-10-03: qty 2

## 2024-10-03 MED ORDER — LACTATED RINGERS IV BOLUS
1000.0000 mL | Freq: Once | INTRAVENOUS | Status: AC
  Administered 2024-10-03: 1000 mL via INTRAVENOUS

## 2024-10-03 MED ORDER — ONDANSETRON HCL 4 MG/2ML IJ SOLN
4.0000 mg | Freq: Once | INTRAMUSCULAR | Status: AC
  Administered 2024-10-03: 4 mg via INTRAVENOUS
  Filled 2024-10-03: qty 2

## 2024-10-03 MED ORDER — IOHEXOL 300 MG/ML  SOLN
100.0000 mL | Freq: Once | INTRAMUSCULAR | Status: AC | PRN
  Administered 2024-10-03: 100 mL via INTRAVENOUS

## 2024-10-03 MED ORDER — PANTOPRAZOLE SODIUM 40 MG IV SOLR
40.0000 mg | Freq: Once | INTRAVENOUS | Status: AC
  Administered 2024-10-03: 40 mg via INTRAVENOUS
  Filled 2024-10-03: qty 10

## 2024-10-03 MED ORDER — OMEPRAZOLE 40 MG PO CPDR: 40.0000 mg | DELAYED_RELEASE_CAPSULE | Freq: Two times a day (BID) | ORAL | 0 refills | Status: AC

## 2024-10-03 NOTE — ED Triage Notes (Signed)
 Pt reports with mid upper abdominal pain and vomiting since Thanksgiving. Pt states that it feel like someone is jabbing him.

## 2024-10-03 NOTE — ED Provider Notes (Signed)
 [place] EMERGENCY DEPARTMENT AT [facility] Provider Note   CSN: [id number] Arrival date & time: 10/03/24  0030     History Chief Complaint  Patient presents with   Abdominal Pain    HPI Joshua Gutierrez is a 38 y.o. male presenting for chief complaint of abdominal pain and nausea and vomitting. States that he has a history of PUD needing surgical repair States that since thanksgiving he has been having frequent abdominal pain Denies fevers chills. Was having some discomfort with gastritis last week Ran out of his protonix .   Patient's recorded medical, surgical, social, medication list and allergies were reviewed in the Snapshot window as part of the initial history.   Review of Systems   Review of Systems  Constitutional:  Negative for chills and fever.  HENT:  Negative for ear pain and sore throat.   Eyes:  Negative for pain and visual disturbance.  Respiratory:  Negative for cough and shortness of breath.   Cardiovascular:  Negative for chest pain and palpitations.  Gastrointestinal:  Negative for vomiting.  Genitourinary:  Negative for dysuria and hematuria.  Musculoskeletal:  Negative for arthralgias and back pain.  Skin:  Negative for color change and rash.  Neurological:  Negative for seizures and syncope.  All other systems reviewed and are negative.   Physical Exam Updated Vital Signs BP 119/74 (BP Location: Left Arm)   Pulse (!) 126   Temp 98.4 F (36.9 C) (Oral)   Resp 20   Ht 5' 6 (1.676 m)   Wt 63.5 kg   SpO2 100%   BMI 22.60 kg/m  Physical Exam Vitals and nursing note reviewed.  Constitutional:      General: He is not in acute distress.    Appearance: He is well-developed.  HENT:     Head: Normocephalic and atraumatic.  Eyes:     Conjunctiva/sclera: Conjunctivae normal.  Cardiovascular:     Rate and Rhythm: Normal rate and regular rhythm.     Heart sounds: No murmur heard. Pulmonary:     Effort: Pulmonary effort is normal. No  respiratory distress.     Breath sounds: Normal breath sounds.  Abdominal:     Palpations: Abdomen is soft.     Tenderness: There is abdominal tenderness. There is no guarding.  Musculoskeletal:        General: No swelling.     Cervical back: Neck supple.  Skin:    General: Skin is warm and dry.     Capillary Refill: Capillary refill takes less than 2 seconds.  Neurological:     Mental Status: He is alert.  Psychiatric:        Mood and Affect: Mood normal.      ED Course/ Medical Decision Making/ A&P    Procedures Procedures   Medications Ordered in ED Medications  pantoprazole  (PROTONIX ) injection 40 mg (40 mg Intravenous Given 10/03/24 0203)  ondansetron  (ZOFRAN ) injection 4 mg (4 mg Intravenous Given 10/03/24 0204)  lactated ringers  bolus 1,000 mL (1,000 mLs Intravenous New Bag/Given 10/03/24 0203)  famotidine (PEPCID) tablet 20 mg (20 mg Oral Given 10/03/24 0205)  acetaminophen  (TYLENOL ) tablet 1,000 mg (1,000 mg Oral Given 10/03/24 0205)  iohexol  (OMNIPAQUE ) 300 MG/ML solution 100 mL (100 mLs Intravenous Contrast Given 10/03/24 0143)   Medical Decision Making:   Joshua Gutierrez is a 38 y.o. male who presented to the ED today with abdominal pain, detailed above.    Additional history discussed with patient's family/caregivers.  Complete initial physical exam  performed, notably the patient  was hemodynamically stable no acute distress.     Reviewed and confirmed nursing documentation for past medical history, family history, social history.    Initial Assessment:   With the patient's presentation of abdominal pain, most likely diagnosis is gastritis/nonspecific etiology. Other diagnoses were considered including (but not limited to) gastroenteritis, colitis, small bowel obstruction, appendicitis, cholecystitis, pancreatitis, nephrolithiasis, UTI, pyleonephritis. These are considered less likely due to history of present illness and physical exam findings.   This is most consistent  with an acute life/limb threatening illness complicated by underlying chronic conditions.   Initial Plan:  CBC/CMP to evaluate for underlying infectious/metabolic etiology for patient's abdominal pain  Lipase to evaluate for pancreatitis  CTAB/Pelvis with contrast to evaluate for structural/surgical etiology of patients' severe abdominal pain.  Urinalysis and repeat physical assessment to evaluate for UTI/Pyelonpehritis  Empiric management of symptoms with escalating pain control and antiemetics as needed.   Initial Study Results:   Laboratory  All laboratory results reviewed without evidence of clinically relevant pathology.     Radiology All images reviewed independently. Agree with radiology report at this time.   CT ABDOMEN PELVIS W CONTRAST Result Date: 10/03/2024 EXAM: CT ABDOMEN AND PELVIS WITH CONTRAST 10/03/2024 01:49:44 AM TECHNIQUE: CT of the abdomen and pelvis was performed with the administration of 100 mL of Iohexol  300 mg/mL solution. Multiplanar reformatted images are provided for review. Automated exposure control, iterative reconstruction, and/or weight-based adjustment of the mA/kV was utilized to reduce the radiation dose to as low as reasonably achievable. COMPARISON: 05/22/2024 CLINICAL HISTORY: Abdominal pain, acute, nonlocalized. FINDINGS: LOWER CHEST: Additional circumferential wall thickening of the lower esophagus. LIVER: The liver is unremarkable. GALLBLADDER AND BILE DUCTS: Gallbladder is unremarkable. No biliary ductal dilatation. SPLEEN: No acute abnormality. PANCREAS: No acute abnormality. ADRENAL GLANDS: No acute abnormality. KIDNEYS, URETERS AND BLADDER: No stones in the kidneys or ureters. No hydronephrosis. No perinephric or periureteral stranding. Urinary bladder is unremarkable. GI AND BOWEL: Wall thickening and mucosal hyperenhancement of the gastric antrum (series 2, image 25). Small bowel intussusception in the left central abdomen (series 7, image 45 and  series 9, image 95). No CT evidence of a lead point. This is likely transient. Normal appendix. There is no bowel obstruction. PERITONEUM AND RETROPERITONEUM: No ascites. No free air. VASCULATURE: Aorta is normal in caliber. LYMPH NODES: No lymphadenopathy. REPRODUCTIVE ORGANS: No acute abnormality. BONES AND SOFT TISSUES: No acute osseous abnormality. No focal soft tissue abnormality. IMPRESSION: 1. Gastritis of the gastric antrum. Recommend correlation with endoscopy. 2. Additional circumferential wall thickening of the lower esophagus compatible with esophagitis. This could also be assessed with endoscopy. 3. Small bowel intussusception in the left central abdomen, likely transient. Short interval follow up CT is recommended to ensure resolution and exclude a neoplastic lead point. Electronically signed by: Norman Gatlin MD 10/03/2024 02:09 AM EST RP Workstation: [id number]   Final Reassessment and Plan:   Patient's HPI and PE findings are consistent with esophageal and gastritis.  This is likely secondary to medication noncompliance.  Patient has had severe peptic ulcer disease but no evidence of them at this time.  He said he was noncompliant with medicine because he could not afford the Protonix  as he ran out of insurance.  He did know who to follow-up with due to loss of his insurance status. Discussed with him that it is critical that he stay on some sort of PPI.  Pulled up the Rx and his omeprazole at full dose would  only be $30 a month and he stated he could afford this. Additionally will provide [facility] access [facility]. Patient feels comfort with outpatient care and management with these resources.  Strict return precautions reinforced.  After medications here in the emergency room he is grossly resolved.   Clinical Impression:  1. Acute gastritis without hemorrhage, unspecified gastritis type      Data Unavailable   Final  Clinical Impression(s) / ED Diagnoses Final diagnoses:  Acute gastritis without hemorrhage, unspecified gastritis type    Rx / DC Orders ED Discharge Orders          Ordered    omeprazole (PRILOSEC) 40 MG capsule  2 times daily before meals        10/03/24 0126              Jerral Meth, MD 10/03/24 [phone]

## 2024-10-03 NOTE — ED Notes (Signed)
 Pt axox4. GCS 15. Pt and significant other of patient verbalizes understanding of discharge instructions, follow up and new Rx. Pt ambulated out of er with steady gait to transportation home with significant other
